# Patient Record
Sex: Male | Born: 1960 | Race: White | Hispanic: No | Smoking: Never smoker
Health system: Southern US, Community
[De-identification: ages and names within clinical notes are randomized; demographics above are authoritative.]

## PROBLEM LIST (undated history)

## (undated) DIAGNOSIS — E663 Overweight: Secondary | ICD-10-CM

## (undated) DIAGNOSIS — K219 Gastro-esophageal reflux disease without esophagitis: Secondary | ICD-10-CM

## (undated) DIAGNOSIS — I10 Essential (primary) hypertension: Secondary | ICD-10-CM

## (undated) DIAGNOSIS — N200 Calculus of kidney: Secondary | ICD-10-CM

## (undated) DIAGNOSIS — Z9889 Other specified postprocedural states: Secondary | ICD-10-CM

## (undated) DIAGNOSIS — E785 Hyperlipidemia, unspecified: Secondary | ICD-10-CM

## (undated) HISTORY — PX: INGUINAL HERNIA REPAIR: SUR1180

## (undated) HISTORY — PX: KIDNEY SURGERY: SHX687

---

## 2012-07-03 ENCOUNTER — Emergency Department (HOSPITAL_COMMUNITY): Payer: Federal, State, Local not specified - PPO

## 2012-07-03 ENCOUNTER — Inpatient Hospital Stay (HOSPITAL_COMMUNITY)
Admission: EM | Admit: 2012-07-03 | Discharge: 2012-07-04 | DRG: 183 | Disposition: A | Discharge status: Home / Self Care | Payer: Federal, State, Local not specified - PPO | Attending: Internal Medicine | Admitting: Internal Medicine

## 2012-07-03 ENCOUNTER — Other Ambulatory Visit: Payer: Self-pay

## 2012-07-03 ENCOUNTER — Encounter (HOSPITAL_COMMUNITY): Payer: Self-pay | Admitting: Emergency Medicine

## 2012-07-03 DIAGNOSIS — R079 Chest pain, unspecified: Secondary | ICD-10-CM | POA: Insufficient documentation

## 2012-07-03 DIAGNOSIS — E785 Hyperlipidemia, unspecified: Secondary | ICD-10-CM | POA: Diagnosis present

## 2012-07-03 DIAGNOSIS — Z7982 Long term (current) use of aspirin: Secondary | ICD-10-CM

## 2012-07-03 DIAGNOSIS — Z79899 Other long term (current) drug therapy: Secondary | ICD-10-CM

## 2012-07-03 DIAGNOSIS — I10 Essential (primary) hypertension: Secondary | ICD-10-CM | POA: Diagnosis present

## 2012-07-03 DIAGNOSIS — K219 Gastro-esophageal reflux disease without esophagitis: Secondary | ICD-10-CM

## 2012-07-03 DIAGNOSIS — I200 Unstable angina: Secondary | ICD-10-CM

## 2012-07-03 DIAGNOSIS — Z6831 Body mass index (BMI) 31.0-31.9, adult: Secondary | ICD-10-CM

## 2012-07-03 DIAGNOSIS — E669 Obesity, unspecified: Secondary | ICD-10-CM | POA: Diagnosis present

## 2012-07-03 HISTORY — DX: Essential (primary) hypertension: I10

## 2012-07-03 HISTORY — DX: Hyperlipidemia, unspecified: E78.5

## 2012-07-03 HISTORY — DX: Other specified postprocedural states: Z98.890

## 2012-07-03 HISTORY — DX: Gastro-esophageal reflux disease without esophagitis: K21.9

## 2012-07-03 HISTORY — DX: Overweight: E66.3

## 2012-07-03 HISTORY — DX: Calculus of kidney: N20.0

## 2012-07-03 LAB — COMPREHENSIVE METABOLIC PANEL
Alkaline Phosphatase: 56 U/L (ref 39–117)
BUN: 20 mg/dL (ref 6–23)
Chloride: 105 mEq/L (ref 96–112)
GFR calc Af Amer: >90 mL/min (ref >90)
Glucose, Bld: 90 mg/dL (ref 70–99)
Potassium: 3.9 mEq/L (ref 3.5–5.1)
Total Bilirubin: 0.4 mg/dL (ref 0.3–1.2)

## 2012-07-03 LAB — POCT I-STAT TROPONIN I: Troponin i, poc: 0 ng/mL (ref 0.00–0.08)

## 2012-07-03 LAB — CBC
HCT: 40 % (ref 39.0–52.0)
Hemoglobin: 13.8 g/dL (ref 13.0–17.0)
WBC: 6.5 10*3/uL (ref 4.0–10.5)

## 2012-07-03 LAB — URINALYSIS, ROUTINE W REFLEX MICROSCOPIC
Glucose, UA: NEGATIVE mg/dL
Hgb urine dipstick: NEGATIVE
Ketones, ur: NEGATIVE mg/dL
Protein, ur: NEGATIVE mg/dL

## 2012-07-03 LAB — PROTIME-INR: Prothrombin Time: 11.8 seconds (ref 11.6–15.2)

## 2012-07-03 LAB — TROPONIN I: Troponin I: <0.3 ng/mL (ref <0.30)

## 2012-07-03 MED ORDER — FLUTICASONE PROPIONATE 50 MCG/ACT NA SUSP
2.0000 | Freq: Every day | NASAL | Status: DC
  Administered 2012-07-03: 2 via NASAL
  Filled 2012-07-03: qty 16

## 2012-07-03 MED ORDER — HEPARIN (PORCINE) IN NACL 100-0.45 UNIT/ML-% IJ SOLN
1200.0000 [IU]/h | INTRAMUSCULAR | Status: DC
  Administered 2012-07-03 – 2012-07-04 (×2): 1200 [IU]/h via INTRAVENOUS
  Filled 2012-07-03 (×2): qty 250

## 2012-07-03 MED ORDER — FENTANYL CITRATE 0.05 MG/ML IJ SOLN
INTRAMUSCULAR | Status: AC
  Filled 2012-07-03: qty 2

## 2012-07-03 MED ORDER — SODIUM CHLORIDE 0.9 % IJ SOLN: 3.0000 mL | Freq: Two times a day (BID) | INTRAMUSCULAR | Status: DC

## 2012-07-03 MED ORDER — ATORVASTATIN CALCIUM 20 MG PO TABS
20.0000 mg | ORAL_TABLET | Freq: Every day | ORAL | Status: DC
  Administered 2012-07-03: 20 mg via ORAL
  Filled 2012-07-03 (×2): qty 1

## 2012-07-03 MED ORDER — ZOLPIDEM TARTRATE 5 MG PO TABS: 5.0000 mg | ORAL_TABLET | Freq: Every evening | ORAL | Status: DC | PRN

## 2012-07-03 MED ORDER — IRBESARTAN 150 MG PO TABS
150.0000 mg | ORAL_TABLET | Freq: Every day | ORAL | Status: DC
  Filled 2012-07-03: qty 1

## 2012-07-03 MED ORDER — SODIUM CHLORIDE 0.9 % IV SOLN: 250.0000 mL | INTRAVENOUS | Status: DC | PRN

## 2012-07-03 MED ORDER — HEPARIN BOLUS VIA INFUSION
4000.0000 [IU] | Freq: Once | INTRAVENOUS | Status: AC
  Administered 2012-07-03: 4000 [IU] via INTRAVENOUS

## 2012-07-03 MED ORDER — NITROGLYCERIN 0.4 MG SL SUBL: 0.4000 mg | SUBLINGUAL_TABLET | SUBLINGUAL | Status: DC | PRN

## 2012-07-03 MED ORDER — ASPIRIN 81 MG PO CHEW
324.0000 mg | CHEWABLE_TABLET | ORAL | Status: AC
  Administered 2012-07-04: 324 mg via ORAL
  Filled 2012-07-03: qty 4

## 2012-07-03 MED ORDER — SODIUM CHLORIDE 0.9 % IJ SOLN: 3.0000 mL | INTRAMUSCULAR | Status: DC | PRN

## 2012-07-03 MED ORDER — MORPHINE SULFATE 4 MG/ML IJ SOLN
4.0000 mg | Freq: Once | INTRAMUSCULAR | Status: AC
  Administered 2012-07-03: 4 mg via INTRAVENOUS
  Filled 2012-07-03: qty 1

## 2012-07-03 MED ORDER — FENTANYL CITRATE 0.05 MG/ML IJ SOLN
25.0000 ug | Freq: Once | INTRAMUSCULAR | Status: AC
  Administered 2012-07-03: 25 ug via INTRAVENOUS

## 2012-07-03 MED ORDER — ONDANSETRON 4 MG PO TBDP
4.0000 mg | ORAL_TABLET | Freq: Once | ORAL | Status: AC
  Administered 2012-07-03: 4 mg via ORAL
  Filled 2012-07-03: qty 1

## 2012-07-03 MED ORDER — SODIUM CHLORIDE 0.9 % IV SOLN
1000.0000 mL | INTRAVENOUS | Status: DC
  Administered 2012-07-03: 1000 mL via INTRAVENOUS

## 2012-07-03 MED ORDER — NITROGLYCERIN IN D5W 200-5 MCG/ML-% IV SOLN
5.0000 ug/min | INTRAVENOUS | Status: DC
  Administered 2012-07-03: 5 ug/min via INTRAVENOUS
  Filled 2012-07-03: qty 250

## 2012-07-03 MED ORDER — ASPIRIN EC 81 MG PO TBEC
81.0000 mg | DELAYED_RELEASE_TABLET | Freq: Every day | ORAL | Status: DC
  Filled 2012-07-03: qty 1

## 2012-07-03 MED ORDER — METOPROLOL SUCCINATE ER 100 MG PO TB24
100.0000 mg | ORAL_TABLET | Freq: Every day | ORAL | Status: DC
  Filled 2012-07-03: qty 1

## 2012-07-03 MED ORDER — ONDANSETRON HCL 4 MG/2ML IJ SOLN: 4.0000 mg | Freq: Four times a day (QID) | INTRAMUSCULAR | Status: DC | PRN

## 2012-07-03 MED ORDER — SODIUM CHLORIDE 0.9 % IV SOLN
1.0000 mL/kg/h | INTRAVENOUS | Status: DC
  Administered 2012-07-04: 1 mL/kg/h via INTRAVENOUS

## 2012-07-03 MED ORDER — ACETAMINOPHEN 325 MG PO TABS
650.0000 mg | ORAL_TABLET | ORAL | Status: DC | PRN
  Administered 2012-07-03: 650 mg via ORAL
  Filled 2012-07-03: qty 2

## 2012-07-03 NOTE — H&P (Signed)
Patient ID: Andrew Burns MRN: [id number], DOB/AGE: 1960/09/15   Admit date: 07/03/2012  Primary Physician: [facility] Primary Cardiologist: new - seen by D. Lagena Strand, MD  Pt. Profile:  52 year old male without prior cardiac history presented to ED this morning secondary to chest pain.  Problem List  Past Medical History  Diagnosis Date  . Hypertension   . Hyperlipemia   . Overweight     Past Surgical History  Procedure Laterality Date  . 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 Hernia repair      Allergies  No Known Allergies  HPI  52 year old male with the above problem list. He has no prior cardiac history. He works at the [employer]. He reports increasing life stress of the past few months related to marital issues. He is currently living at home with his 2 children, a 66 year old daughter, and a 75 year old handicapped son. He was in his usual state of health until yesterday when he noticed some dizziness after work. He did not feel like he was going to pass out. This persisted until he went to bed at night. When he got up this morning at approximately 5:30 AM, he noted to have 10 substernal chest pressure with mild dyspnea and no other associated symptoms. This persisted while he got ready for work and then presented to work at about 7am and told his boss that he was feeling poorly.  They called 911.  Upon EMS arrival pt received 2 sl ntg with relief of discomfort.  He was taken to the [facility] ED where ECG shows ant and inf twi - no old ecg for comparison.  His pain has waxed and waned here.  First two poc troponins are normal.  He is currently pain free.  Home Medications  Prior to Admission medications   Medication Sig Start Date End Date Taking? Authorizing Provider  aspirin EC 81 MG tablet Take 81 mg by mouth daily.   Yes Historical Provider, MD  atorvastatin (LIPITOR) 20 MG tablet Take 20 mg by mouth at bedtime.   Yes Historical Provider, MD    fluticasone (FLONASE) 50 MCG/ACT nasal spray Place 2 sprays into the nose at bedtime.   Yes Historical Provider, MD  metoprolol succinate (TOPROL-XL) 100 MG 24 hr tablet Take 100 mg by mouth daily. Take with or immediately following a meal.   Yes Historical Provider, MD  olmesartan (BENICAR) 20 MG tablet Take 20 mg by mouth daily.   Yes Historical Provider, MD  vitamin E 200 UNIT capsule Take 200 Units by mouth daily.   Yes Historical Provider, MD  aspirin 325 MG tablet Take 324 mg by mouth once.    Historical Provider, MD  nitroGLYCERIN (NITROSTAT) 0.4 MG SL tablet Place 0.4 mg under the tongue.    Historical Provider, MD   Family History  Family History  Problem Relation Age of Onset  . Stroke Father     multiple - first in his 40's, died @ 40  . Heart attack Mother     unclear timing - she is alive and well @ 86  . Other Brother     4 brothers - alive and well  . Other Sister     2 sisters - alive and well  . Heart attack Sister     died in her 26's, ? MI/drug abuse.    Social History  History   Social History  . Marital Status: Legally Separated    Spouse Name: N/A  Number of Children: N/A  . Years of Education: N/A   Occupational History  . Not on file.   Social History Main Topics  . Smoking status: Never Smoker   . Smokeless tobacco: Not on file  . Alcohol Use: 1.2 oz/week    2 Cans of beer per week     Comment: rare drink  . Drug Use: No  . Sexually Active: Not on file   Other Topics Concern  . Not on file   Social History Narrative   Lives in [place].  He is married but separated.  His two children are staying with him.  His son is 48 but handicapped ("mind of a 15 yr old).  His dtr is 15.    Review of Systems General:  No chills, fever, night sweats or weight changes.  Cardiovascular:  +++ chest pain, +++ dyspnea on exertion, no edema, orthopnea, palpitations, paroxysmal nocturnal dyspnea. Dermatological: No rash, lesions/masses Respiratory: No  cough, dyspnea Urologic: No hematuria, dysuria Abdominal:   No nausea, vomiting, diarrhea, bright red blood per rectum, melena, or hematemesis Neurologic:  No visual changes, wkns, changes in mental status. All other systems reviewed and are otherwise negative except as noted above.  Physical Exam  Blood pressure 127/79, pulse 75, temperature 98.2 F (36.8 C), temperature source Oral, resp. rate 14, height 5\' 7"  (1.702 m), weight 190 lb (86.183 kg), SpO2 96.00%.  General: Pleasant, NAD Psych: Normal affect. Neuro: Alert and oriented X 3. Moves all extremities spontaneously. HEENT: Normal  Neck: Supple without JVD.  Soft R carotid bruit. Lungs:  Resp regular and unlabored, CTA. Heart: RRR no s3, s4, or murmurs. Abdomen: Soft, non-tender, non-distended, BS + x 4.  Extremities: No clubbing, cyanosis or edema. DP/PT/Radials 2+ and equal bilaterally.  Labs  Trop i, poc: 0.00 x 2.  Lab Results  Component Value Date   WBC 6.5 07/03/2012   HGB 13.8 07/03/2012   HCT 40.0 07/03/2012   MCV 89.3 07/03/2012   PLT 227 07/03/2012     Recent Labs Lab 07/03/12 0955  NA 138  K 3.9  CL 105  CO2 25  BUN 20  CREATININE 0.75  CALCIUM 8.6  PROT 6.4  BILITOT 0.4  ALKPHOS 56  ALT 30  AST 18  GLUCOSE 90   Radiology/Studies  Dg Chest Portable 1 View  07/03/2012   *RADIOLOGY REPORT*  Clinical Data: Chest tightness.  Shortness of breath.  PORTABLE CHEST - 1 VIEW  Comparison: None.  Findings: Lungs are clear.  Heart size is normal.  No pneumothorax or pleural fluid.  IMPRESSION: Negative chest.   Original Report Authenticated By: Holley Dexter, M.D.    ECG  Rsr, 62, ant/inf twi.  ASSESSMENT :  1. [place] 2. HTN 3. HL 4. Obesity  Signed, Nicolasa Ducking, NP 07/03/2012, 2:49 PM  Plan/Discussion:  Patient seen and examined independently. Gilford Raid, NP note reviewed carefully - agree with his assessment and plan. I have edited the note based on my findings. Symptoms and ECG  concerning for [place]. Will admit for cath. Treat with heparin, ASA, NTG, b-blocker and statin. Will need aggressive RF management.   Alisah Grandberry,MD 3:00 PM

## 2012-07-03 NOTE — Consult Note (Signed)
ANTICOAGULATION CONSULT NOTE  Pharmacy Consult for Heparin Indication: chest pain/ACS  No Known Allergies  Patient Measurements: Height: 5\' 7"  (170.2 cm) Weight: 198 lb 3.1 oz (89.9 kg) IBW/kg (Calculated) : 66.1 Heparin Dosing Weight: 83.6kg  Vital Signs: Temp: 98.4 F (36.9 C) (06/18 2100) Temp src: Oral (06/18 2100) BP: 114/71 mmHg (06/18 2100) Pulse Rate: 71 (06/18 2100)  Labs:  Recent Labs  07/03/12 0955 07/03/12 1601 07/03/12 2200  HGB 13.8  --   --   HCT 40.0  --   --   PLT 227  --   --   APTT 25  --   --   LABPROT 11.8  --   --   INR 0.87  --   --   HEPARINUNFRC  --   --  0.40  CREATININE 0.75  --   --   TROPONINI  --  <0.30  --     Estimated Creatinine Clearance: 118.1 ml/min (by C-G formula based on Cr of 0.75).  Assessment: 52 yo male with chest pain for heparin Goal of Therapy:  Heparin level 0.3-0.7 units/ml Monitor platelets by anticoagulation protocol: Yes   Plan:  Continue Heparin at current rate Follow-up am labs.  Eddie Candle 07/03/2012,11:36 PM

## 2012-07-03 NOTE — ED Notes (Signed)
MD Davidson at bedside.

## 2012-07-03 NOTE — ED Notes (Signed)
Pt provided with crackers and water.

## 2012-07-03 NOTE — Consult Note (Signed)
ANTICOAGULATION CONSULT NOTE - Initial Consult  Pharmacy Consult for Heparin Indication: chest pain/ACS  No Known Allergies  Patient Measurements: Height: 5\' 7"  (170.2 cm) Weight: 190 lb (86.183 kg) IBW/kg (Calculated) : 66.1 Heparin Dosing Weight: 83.6kg  Vital Signs: Temp: 98.2 F (36.8 C) (06/18 0847) Temp src: Oral (06/18 0847) BP: 130/81 mmHg (06/18 1521) Pulse Rate: 73 (06/18 1521)  Labs:  Recent Labs  07/03/12 0955  HGB 13.8  HCT 40.0  PLT 227  APTT 25  LABPROT 11.8  INR 0.87  CREATININE 0.75    Estimated Creatinine Clearance: 115.8 ml/min (by C-G formula based on Cr of 0.75).   Medical History: Past Medical History  Diagnosis Date  . Hypertension   . Hyperlipemia   . Overweight     Medications:  No anticoagulants pta  Assessment: 51yom with history of HTN and HLD presents to the ED with CP. First two troponins are negative. He will begin heparin with plans for cath this admission. Baseline CBC and renal function wnl.  Goal of Therapy:  Heparin level 0.3-0.7 units/ml Monitor platelets by anticoagulation protocol: Yes   Plan:  1) Heparin bolus 4000 units x 1 2) Heparin drip at 1200 units/hr 3) 6 hour heparin level 4) Daily heparin level and CBC  Fredrik Rigger 07/03/2012,3:48 PM

## 2012-07-03 NOTE — ED Notes (Addendum)
Pt started having dizziness last night and started having chest pain this am when he woke. Pt originally had pain level of 2/10. EMS gave ASA 324 mg and NTG x2 SL.  Pt reports pain level on arrival was relieved to 1/10 by medication.

## 2012-07-03 NOTE — ED Notes (Signed)
Cardiology consult at bedside.

## 2012-07-03 NOTE — ED Provider Notes (Signed)
History     CSN: [id number]  Arrival date & time 07/03/12  4098   First MD Initiated Contact with Patient 07/03/12 [phone]      Chief Complaint  Patient presents with  . Chest Pain    (Consider location/radiation/quality/duration/timing/severity/associated sxs/prior treatment) Patient is a 52 y.o. male presenting with chest pain. The history is provided by the patient. No language interpreter was used.  Chest Pain Pain location:  Substernal area (Patient had felt dizzy yesterday. This morning he woke up with a tightness in his chest. He says it's hard for him to breathe. He had prior chest pain in 2009, was found to be hypertensive, and was placed on Benicar that time.) Pain quality: tightness   Pain quality comment:  He called EMS and was given aspirin and nitroglycerin, with some relief.  Now the tightness is coming back.   Pain radiates to:  Does not radiate Pain severity:  Moderate Duration: Intermittent pain. Timing:  Intermittent Progression:  Unchanged Chronicity:  New Relieved by: Transient relief with aspirin and nitroglycerin.  Worsened by:  Nothing tried Ineffective treatments:  None tried Associated symptoms: dizziness   Risk factors: hypertension and male sex     Past Medical History  Diagnosis Date  . Hypertension   . Hyperlipemia     Past Surgical History  Procedure Laterality Date  . 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 Hernia repair      History reviewed. No pertinent family history.  History  Substance Use Topics  . Smoking status: Never Smoker   . Smokeless tobacco: Not on file  . Alcohol Use: 1.2 oz/week    2 Cans of beer per week      Review of Systems  Constitutional: Negative.   HENT: Negative.   Eyes: Negative.   Respiratory: Negative.   Cardiovascular: Positive for chest pain.       History of hypertension.  Genitourinary: Negative.   Musculoskeletal: Negative.   Skin: Negative.   Neurological: Positive for dizziness.   Psychiatric/Behavioral: Negative.     Allergies  Review of patient's allergies indicates no known allergies.  Home Medications   Current Outpatient Rx  Name  Route  Sig  Dispense  Refill  . aspirin EC 81 MG tablet   Oral   Take 81 mg by mouth daily.         . fluticasone (FLONASE) 50 MCG/ACT nasal spray   Nasal   Place 2 sprays into the nose at bedtime.         . Metoprolol Succinate (TOPROL XL PO)   Oral   Take by mouth.         . olmesartan (BENICAR) 20 MG tablet   Oral   Take 20 mg by mouth daily.         Marland Kitchen PRESCRIPTION MEDICATION   Oral   Take 1 tablet by mouth at bedtime.         . vitamin E 200 UNIT capsule   Oral   Take 200 Units by mouth daily.         Marland Kitchen aspirin 325 MG tablet   Oral   Take 324 mg by mouth once.         . nitroGLYCERIN (NITROSTAT) 0.4 MG SL tablet   Sublingual   Place 0.4 mg under the tongue.           BP 128/93  Pulse 61  Temp(Src) 98.2 F (36.8 C) (Oral)  Resp 18  Ht  5\' 7"  (1.702 m)  Wt 190 lb (86.183 kg)  BMI 29.75 kg/m2  SpO2 98%  Physical Exam  Nursing note and vitals reviewed. Constitutional: He is oriented to person, place, and time.  Middle aged man, in moderate distress with chest tightness.  HENT:  Head: Normocephalic and atraumatic.  Right Ear: External ear normal.  Left Ear: External ear normal.  Mouth/Throat: Oropharynx is clear and moist.  Eyes: Conjunctivae and EOM are normal. Pupils are equal, round, and reactive to light.  Neck: Normal range of motion. Neck supple.  Cardiovascular: Normal rate, regular rhythm and normal heart sounds.   Pulmonary/Chest: Effort normal and breath sounds normal.  Abdominal: Soft. Bowel sounds are normal.  Musculoskeletal: Normal range of motion. He exhibits no edema and no tenderness.  Neurological: He is alert and oriented to person, place, and time.  No sensory or motor deficit.   Skin: Skin is warm and dry.  Psychiatric: He has a normal mood and affect.  His behavior is normal.    ED Course  Procedures (including critical care time)   Date: 07/03/2012  Rate: 62  Rhythm: normal sinus rhythm  QRS Axis: normal  Intervals: normal  ST/T Wave abnormalities: None  Conduction Disutrbances:   Incomplete right bundle branch block  Narrative Interpretation:  Borderline EKG  Old EKG Reviewed: none available  9:52 AM Pt seen --> physical exam performed.  He is having recurrent pain despite po aspirin and sublingual nitroglycerin.  Will give IV morphine and Zofran, and start IV nitroglycerin.   11:25 AM Results for orders placed during the hospital encounter of 07/03/12  CBC      Result Value Range   WBC 6.5  4.0 - 10.5 K/uL   RBC 4.48  4.22 - 5.81 MIL/uL   Hemoglobin 13.8  13.0 - 17.0 g/dL   HCT 91.4  78.2 - 95.6 %   MCV 89.3  78.0 - 100.0 fL   MCH 30.8  26.0 - 34.0 pg   MCHC 34.5  30.0 - 36.0 g/dL   RDW 21.3  08.6 - 57.8 %   Platelets 227  150 - 400 K/uL  COMPREHENSIVE METABOLIC PANEL      Result Value Range   Sodium 138  135 - 145 mEq/L   Potassium 3.9  3.5 - 5.1 mEq/L   Chloride 105  96 - 112 mEq/L   CO2 25  19 - 32 mEq/L   Glucose, Bld 90  70 - 99 mg/dL   BUN 20  6 - 23 mg/dL   Creatinine, Ser 4.69  0.50 - 1.35 mg/dL   Calcium 8.6  8.4 - 62.9 mg/dL   Total Protein 6.4  6.0 - 8.3 g/dL   Albumin 3.7  3.5 - 5.2 g/dL   AST 18  0 - 37 U/L   ALT 30  0 - 53 U/L   Alkaline Phosphatase 56  39 - 117 U/L   Total Bilirubin 0.4  0.3 - 1.2 mg/dL   GFR calc non Af Amer >90  >90 mL/min   GFR calc Af Amer >90  >90 mL/min  PROTIME-INR      Result Value Range   Prothrombin Time 11.8  11.6 - 15.2 seconds   INR 0.87  0.00 - 1.49  APTT      Result Value Range   aPTT 25  24 - 37 seconds  URINALYSIS, ROUTINE W REFLEX MICROSCOPIC      Result Value Range   Color, Urine YELLOW  YELLOW  APPearance CLEAR  CLEAR   Specific Gravity, Urine 1.014  1.005 - 1.030   pH 6.0  5.0 - 8.0   Glucose, UA NEGATIVE  NEGATIVE mg/dL   Hgb urine dipstick  NEGATIVE  NEGATIVE   Bilirubin Urine NEGATIVE  NEGATIVE   Ketones, ur NEGATIVE  NEGATIVE mg/dL   Protein, ur NEGATIVE  NEGATIVE mg/dL   Urobilinogen, UA 0.2  0.0 - 1.0 mg/dL   Nitrite NEGATIVE  NEGATIVE   Leukocytes, UA NEGATIVE  NEGATIVE  POCT I-STAT TROPONIN I      Result Value Range   Troponin i, poc 0.00  0.00 - 0.08 ng/mL   Comment 3            Dg Chest Portable 1 View  07/03/2012   *RADIOLOGY REPORT*  Clinical Data: Chest tightness.  Shortness of breath.  PORTABLE CHEST - 1 VIEW  Comparison: None.  Findings: Lungs are clear.  Heart size is normal.  No pneumothorax or pleural fluid.  IMPRESSION: Negative chest.   Original Report Authenticated By: Holley Dexter, M.D.    11:25 AM Lab tests reassuringly negative.  Pt pain free on nitroglycerin continuous infusion.  Call to [facility] to see pt for unstable angina.   1. Unstable angina   2. Hypertension             Carleene Cooper III, MD 07/04/12 [phone]

## 2012-07-04 ENCOUNTER — Encounter (HOSPITAL_COMMUNITY): Payer: Self-pay | Admitting: Physician Assistant

## 2012-07-04 ENCOUNTER — Encounter (HOSPITAL_COMMUNITY)
Admission: EM | Disposition: A | Discharge status: Home / Self Care | Payer: Self-pay | Source: Home / Self Care | Attending: Internal Medicine

## 2012-07-04 DIAGNOSIS — K219 Gastro-esophageal reflux disease without esophagitis: Secondary | ICD-10-CM

## 2012-07-04 DIAGNOSIS — R079 Chest pain, unspecified: Secondary | ICD-10-CM

## 2012-07-04 HISTORY — PX: LEFT HEART CATHETERIZATION WITH CORONARY ANGIOGRAM: SHX5451

## 2012-07-04 LAB — CBC
Hemoglobin: 13.7 g/dL (ref 13.0–17.0)
MCH: 30.4 pg (ref 26.0–34.0)
MCV: 90.7 fL (ref 78.0–100.0)
RBC: 4.51 MIL/uL (ref 4.22–5.81)

## 2012-07-04 LAB — TROPONIN I: Troponin I: <0.3 ng/mL (ref <0.30)

## 2012-07-04 LAB — LIPID PANEL
Total CHOL/HDL Ratio: 3.7 RATIO
VLDL: 37 mg/dL (ref 0–40)

## 2012-07-04 SURGERY — LEFT HEART CATHETERIZATION WITH CORONARY ANGIOGRAM
Anesthesia: LOCAL

## 2012-07-04 MED ORDER — SODIUM CHLORIDE 0.9 % IV SOLN: INTRAVENOUS | Status: AC

## 2012-07-04 MED ORDER — HEPARIN (PORCINE) IN NACL 2-0.9 UNIT/ML-% IJ SOLN
INTRAMUSCULAR | Status: AC
  Filled 2012-07-04: qty 1000

## 2012-07-04 MED ORDER — HEPARIN SODIUM (PORCINE) 1000 UNIT/ML IJ SOLN
INTRAMUSCULAR | Status: AC
  Filled 2012-07-04: qty 1

## 2012-07-04 MED ORDER — NITROGLYCERIN 0.2 MG/ML ON CALL CATH LAB
INTRAVENOUS | Status: AC
  Filled 2012-07-04: qty 1

## 2012-07-04 MED ORDER — MIDAZOLAM HCL 2 MG/2ML IJ SOLN
INTRAMUSCULAR | Status: AC
  Filled 2012-07-04: qty 2

## 2012-07-04 MED ORDER — PANTOPRAZOLE SODIUM 40 MG PO TBEC: 40.0000 mg | DELAYED_RELEASE_TABLET | Freq: Every day | ORAL | Status: DC

## 2012-07-04 MED ORDER — PANTOPRAZOLE SODIUM 40 MG PO TBEC
40.0000 mg | DELAYED_RELEASE_TABLET | Freq: Every day | ORAL | Status: DC
  Administered 2012-07-04: 11:00:00 40 mg via ORAL
  Filled 2012-07-04: qty 1

## 2012-07-04 MED ORDER — VERAPAMIL HCL 2.5 MG/ML IV SOLN
INTRAVENOUS | Status: AC
  Filled 2012-07-04: qty 2

## 2012-07-04 MED ORDER — LIDOCAINE HCL (PF) 1 % IJ SOLN
INTRAMUSCULAR | Status: AC
  Filled 2012-07-04: qty 30

## 2012-07-04 MED ORDER — FENTANYL CITRATE 0.05 MG/ML IJ SOLN
INTRAMUSCULAR | Status: AC
  Filled 2012-07-04: qty 2

## 2012-07-04 NOTE — Progress Notes (Signed)
Cath lab called and said pt is being discharged directly. All belongings packed up and their tech is coming to get them for discharge.   Delynn Flavin, RN, BSN

## 2012-07-04 NOTE — Discharge Summary (Signed)
Discharge Summary   Patient ID: Andrew Burns MRN: [id number], DOB/AGE: [birth year] 52 y.o. Admit date: 07/03/2012 D/C date:     07/04/2012   Primary Discharge Diagnoses:  1. Chest pain, felt secondary to GERD - PPI added - cath showing no evidence of CAD 07/04/12  Secondary Discharge Diagnoses:  1. HTN 2. HLD 3. Overweight   Hospital Course: Andrew Burns is a 52 y/o M with history of HTN, hyperlipidemia who presented to [facility] with chest pain. He has had increasing life stress related to marital issues. He was in his usual state of health until the day before yesterday when he noticed some dizziness after work. He did not feel like he was going to pass out. This persisted until he went to bed at night. He got up the following morning at approximately 5:30 AM, he noted to have 10 substernal chest pressure with mild dyspnea and no other associated symptoms. He denied any syncope or palpitations. This persisted while he got ready for work and then presented to work at about 7am and told his boss that he was feeling poorly. They called 911. He was given 2 SL NTG with relief of discomfort. ECG showed anterior and inferior TWI with no old ECG for comparison. Troponins were normal. CXR was negative. Otherwise labs were unremarkable. Vitals were stable. He was not tachycardic, hypoxic or tachypnic. His symptoms were felt concerning for [place] so he was admitted with plan for cath. He was placed on IV heparin.Troponins remained negative. TSH was normal. Cardiac cath was performed this morning showing no evidence of CAD. EF was 55-60%. His chest pain was felt likely related to GERD and Dr. Clifton James recommended Protonix 40mg  daily. Andrew Burns does note a significant history of this in the past. He has had no further dizziness.  Dr. Clifton James has seen and examined the patient today and feels he is stable for discharge. Dr. Clifton James would like the patient to follow up with his PCP and does not  feel that the patient requires any further cardiac workup. He was instructed to contact his PCP if the dizziness recurs, and not to drive if he has another episode.  Discharge Vitals: Blood pressure 131/79, pulse 72, temperature 98.5 F (36.9 C), temperature source Oral, resp. rate 18, height 5\' 7"  (1.702 m), weight 198 lb 3.1 oz (89.9 kg), SpO2 95.00%.  Labs: Lab Results  Component Value Date   WBC 6.6 07/04/2012   HGB 13.7 07/04/2012   HCT 40.9 07/04/2012   MCV 90.7 07/04/2012   PLT 228 07/04/2012     Recent Labs Lab 07/03/12 0955  NA 138  K 3.9  CL 105  CO2 25  BUN 20  CREATININE 0.75  CALCIUM 8.6  PROT 6.4  BILITOT 0.4  ALKPHOS 56  ALT 30  AST 18  GLUCOSE 90    Recent Labs  07/03/12 1601 07/03/12 2201 07/04/12 0425  TROPONINI <0.30 <0.30 <0.30   Lab Results  Component Value Date   CHOL 189 07/04/2012   HDL 51 07/04/2012   LDLCALC 101* 07/04/2012   TRIG 186* 07/04/2012    Diagnostic Studies/Procedures   Cardiac catheterization this admission, please see full report and above for summary.  Dg Chest Portable 1 View6/18/2014   *RADIOLOGY REPORT*  Clinical Data: Chest tightness.  Shortness of breath.  PORTABLE CHEST - 1 VIEW  Comparison: None.  Findings: Lungs are clear.  Heart size is normal.  No pneumothorax or pleural fluid.  IMPRESSION: Negative chest.  Original Report Authenticated By: Holley Dexter, M.D.    Discharge Medications     Medication List    TAKE these medications       aspirin EC 81 MG tablet  Take 81 mg by mouth daily.     atorvastatin 20 MG tablet  Commonly known as:  LIPITOR  Take 20 mg by mouth at bedtime.     fluticasone 50 MCG/ACT nasal spray  Commonly known as:  FLONASE  Place 2 sprays into the nose at bedtime.     metoprolol succinate 100 MG 24 hr tablet  Commonly known as:  TOPROL-XL  Take 100 mg by mouth daily. Take with or immediately following a meal.     olmesartan 20 MG tablet  Commonly known as:  BENICAR  Take 20  mg by mouth daily.     pantoprazole 40 MG tablet  Commonly known as:  PROTONIX  Take 1 tablet (40 mg total) by mouth daily.     vitamin E 200 UNIT capsule  Take 200 Units by mouth daily.        Disposition   The patient will be discharged in stable condition to home. Discharge Orders   Future Orders Complete By Expires     Diet - low sodium heart healthy  As directed     Increase activity slowly  As directed     Comments:      No driving for 2 days. Do not drive if you are feeling dizzy. No lifting over 5 lbs for 1 week. No sexual activity for 1 week. You may return to work on 07/11/12. Keep procedure site clean & dry. If you notice increased pain, swelling, bleeding or pus, call/return!  You may shower, but no soaking baths/hot tubs/pools for 1 week.      Follow-up Information   Follow up with Primary Care Provider. (For further evaluation and routine care)       Follow up with [facility], MD. (If you notice any problems with your cath site, feel free to call our office.)    Contact information:   [address] [place] [address] [phone]         Duration of Discharge Encounter: Greater than 30 minutes including physician and PA time.  Signed, Ronie Spies PA-C 07/04/2012, 2:40 PM

## 2012-07-04 NOTE — Progress Notes (Signed)
Utilization Review Completed Raynie Steinhaus J. Athina Fahey, RN, BSN, NCM [phone]  

## 2012-07-04 NOTE — Interval H&P Note (Signed)
History and Physical Interval Note:  07/04/2012 8:59 AM  Andrew Burns  has presented today for cardiac cath with the diagnosis of cp  The various methods of treatment have been discussed with the patient and family. After consideration of risks, benefits and other options for treatment, the patient has consented to  Procedure(s): LEFT HEART CATHETERIZATION WITH CORONARY ANGIOGRAM (N/A) as a surgical intervention .  The patient's history has been reviewed, patient examined, no change in status, stable for surgery.  I have reviewed the patient's chart and labs.  Questions were answered to the patient's satisfaction.     MCALHANY,CHRISTOPHER

## 2012-07-04 NOTE — CV Procedure (Signed)
    Cardiac Catheterization Operative Report  Andrew Burns [id number] 6/19/20149:29 AM No primary provider on file.  Procedure Performed:  1. Left Heart Catheterization 2. Selective Coronary Angiography 3. Left ventricular angiogram  Operator: Verne Carrow, MD  Arterial access site:  Right radial artery.   Indication: 52 yo male with history of HTN, HLD admitted with chest pain. He was seen by Dr. Gala Romney and cardiac cath arranged to exclude CAD.                                       Procedure Details: The risks, benefits, complications, treatment options, and expected outcomes were discussed with the patient. The patient and/or family concurred with the proposed plan, giving informed consent. The patient was brought to the cath lab after IV hydration was begun and oral premedication was given. The patient was further sedated with Versed and Fentanyl. The right wrist was assessed with an Allens test which was positive. The right wrist was prepped and draped in a sterile fashion. 1% lidocaine was used for local anesthesia. Using the modified Seldinger access technique, a 5 French sheath was placed in the right radial artery. 3 mg Verapamil was given through the sheath. 4500 units IV heparin was given. Standard diagnostic catheters were used to perform selective coronary angiography. A pigtail catheter was used to perform a left ventricular angiogram. The sheath was removed from the right radial artery and a Terumo hemostasis band was applied at the arteriotomy site on the right wrist.    There were no immediate complications. The patient was taken to the recovery area in stable condition.   Hemodynamic Findings: Central aortic pressure: 109/69 Left ventricular pressure: 108/10/14  Angiographic Findings:  Left main: No obstructive disease.   Left Anterior Descending Artery: Large caliber vessel that courses to the apex without obstructive disease. Moderate caliber  diagonal branch without obstructive disease.   Circumflex Artery: Moderate caliber vessel with moderate caliber obtuse marginal branch. No obstructive disease.   Right Coronary Artery: Large dominant vessel with no obstructive disease.   Left Ventricular Angiogram: LVEF=55-60%.  Impression: 1. No angiographic evidence of CAD 2. Normal LV systolic function 3. Non-cardiac chest pain. Likely secondary to GERD  Recommendations: Will start Protonix 40 mg po QDaily. No further ischemic workup. D/C home after bedrest. Follow up in primary care.        Complications:  None. The patient tolerated the procedure well.

## 2012-07-05 NOTE — Discharge Summary (Signed)
See full note.cdm 

## 2012-07-14 ENCOUNTER — Other Ambulatory Visit (HOSPITAL_COMMUNITY): Payer: Self-pay | Admitting: Physician Assistant

## 2012-07-25 ENCOUNTER — Ambulatory Visit (INDEPENDENT_AMBULATORY_CARE_PROVIDER_SITE_OTHER): Payer: Federal, State, Local not specified - PPO | Admitting: Physician Assistant

## 2012-07-25 ENCOUNTER — Encounter: Payer: Self-pay | Admitting: Physician Assistant

## 2012-07-25 VITALS — BP 110/80 | HR 60 | Ht 67.0 in | Wt 201.0 lb

## 2012-07-25 DIAGNOSIS — R42 Dizziness and giddiness: Secondary | ICD-10-CM

## 2012-07-25 DIAGNOSIS — R079 Chest pain, unspecified: Secondary | ICD-10-CM

## 2012-07-25 DIAGNOSIS — I10 Essential (primary) hypertension: Secondary | ICD-10-CM

## 2012-07-25 NOTE — Patient Instructions (Addendum)
Your physician recommends that you schedule a follow-up appointment as needed with Cardiology.   Please contact your PCP to discuss dizziness.

## 2012-07-25 NOTE — Assessment & Plan Note (Signed)
Currently being worked up by Dr. in [place]. Is a little better with meclizine. Patient has not been orthostatic in the past.

## 2012-07-25 NOTE — Progress Notes (Signed)
HPI:  This is a 52 year old patient who underwent cardiac catheterization on 07/04/12 for chest pain and T wave inversion anterior inferior. Cardiac cath revealed normal coronary arteries and normal LV function. The patient also has had dizziness that has been going on for his chest pain. He is now being seen by Dr. in [place] and treated for vertigo. He has not been orthostatic in the past. He also has hypertension and hyperlipidemia followed by his primary care doctor. He has not had any chest pain since his hospitalization.  No Known Allergies  Current Outpatient Prescriptions on File Prior to Visit: aspirin EC 81 MG tablet, Take 81 mg by mouth daily., Disp: , Rfl:  atorvastatin (LIPITOR) 20 MG tablet, Take 20 mg by mouth at bedtime., Disp: , Rfl:  fluticasone (FLONASE) 50 MCG/ACT nasal spray, Place 2 sprays into the nose at bedtime., Disp: , Rfl:  olmesartan (BENICAR) 20 MG tablet, Take 20 mg by mouth daily., Disp: , Rfl:    No current facility-administered medications on file prior to visit.   Past Medical History:   Hypertension                                                 Hyperlipemia                                                 Overweight                                                   Kidney stones                                                  Comment:"no surgeries; have plenty in me" (07/03/2012)   GERD (gastroesophageal reflux disease)                       H/O cardiac catheterization                                    Comment:Normal coronaries with normal EF 06/2012  Past Surgical History:   INGUINAL HERNIA REPAIR                          Right ~ 2012       KIDNEY SURGERY                                  Right ~ 2005         Comment:tumor removed from kidney- benign  Review of patient's family history indicates:   Stroke                         Father  Comment: multiple - first in his 67's, died @ 76   Heart attack                   Mother                      Comment: unclear timing - she is alive and well @              91   Other                          Brother                    Comment: 4 brothers - alive and well   Other                          Sister                     Comment: 2 sisters - alive and well   Heart attack                   Sister                     Comment: died in her 75's, ? MI/drug abuse.   Social History   Marital Status: Legally Separated   Spouse Name:                      Years of Education:                 Number of children:             Occupational History   None on file  Social History Main Topics   Smoking Status: Never Smoker                     Smokeless Status: Never Used                       Alcohol Use: Yes           3.6 oz/week      6 Cans of beer per week      Comment: 07/03/2012 "1-2 cans beer qod"   Drug Use: No             Sexual Activity: Not Currently      Other Topics            Concern   None on file  Social History Narrative   Lives in [place].  He is married but separated.  His two children are staying with him.  His son is 18 but handicapped ("mind of a 54 yr old).  His dtr is 15.    ROS: See history of present illness otherwise negative   PHYSICAL EXAM: Well-nournished, in no acute distress. Neck: No JVD, HJR, Bruit, or thyroid enlargement  Lungs: No tachypnea, clear without wheezing, rales, or rhonchi  Cardiovascular: RRR, PMI not displaced, heart sounds normal, no murmurs, gallops, bruit, thrill, or heave.  Abdomen: BS normal. Soft without organomegaly, masses, lesions or tenderness.  Extremities: Right arm and cath site without hematoma or hemorrhage good brachial and radial pulses, lower extremities without cyanosis, clubbing or edema. Good distal pulses bilateral  SKin: Warm, no lesions or rashes   Musculoskeletal: No deformities  Neuro: no focal signs  BP 110/80  Pulse 60  Ht 5\' 7"  (1.702 m)  Wt 201 lb (91.173 kg)  BMI 31.47  kg/m2

## 2012-07-25 NOTE — Assessment & Plan Note (Signed)
Normal coronary arteries and LV function on cardiac cath on 07/04/12

## 2012-07-25 NOTE — Assessment & Plan Note (Signed)
Stable

## 2013-12-25 ENCOUNTER — Encounter (HOSPITAL_COMMUNITY): Payer: Self-pay | Admitting: Cardiovascular Disease

## 2014-01-08 IMAGING — DX DG CHEST 1V PORT
1 series · 1 of 1 positions shown · non-contrast
Comparison: None.

CLINICAL DATA: Chest tightness.  Shortness of breath.

PORTABLE CHEST - 1 VIEW

[portable]
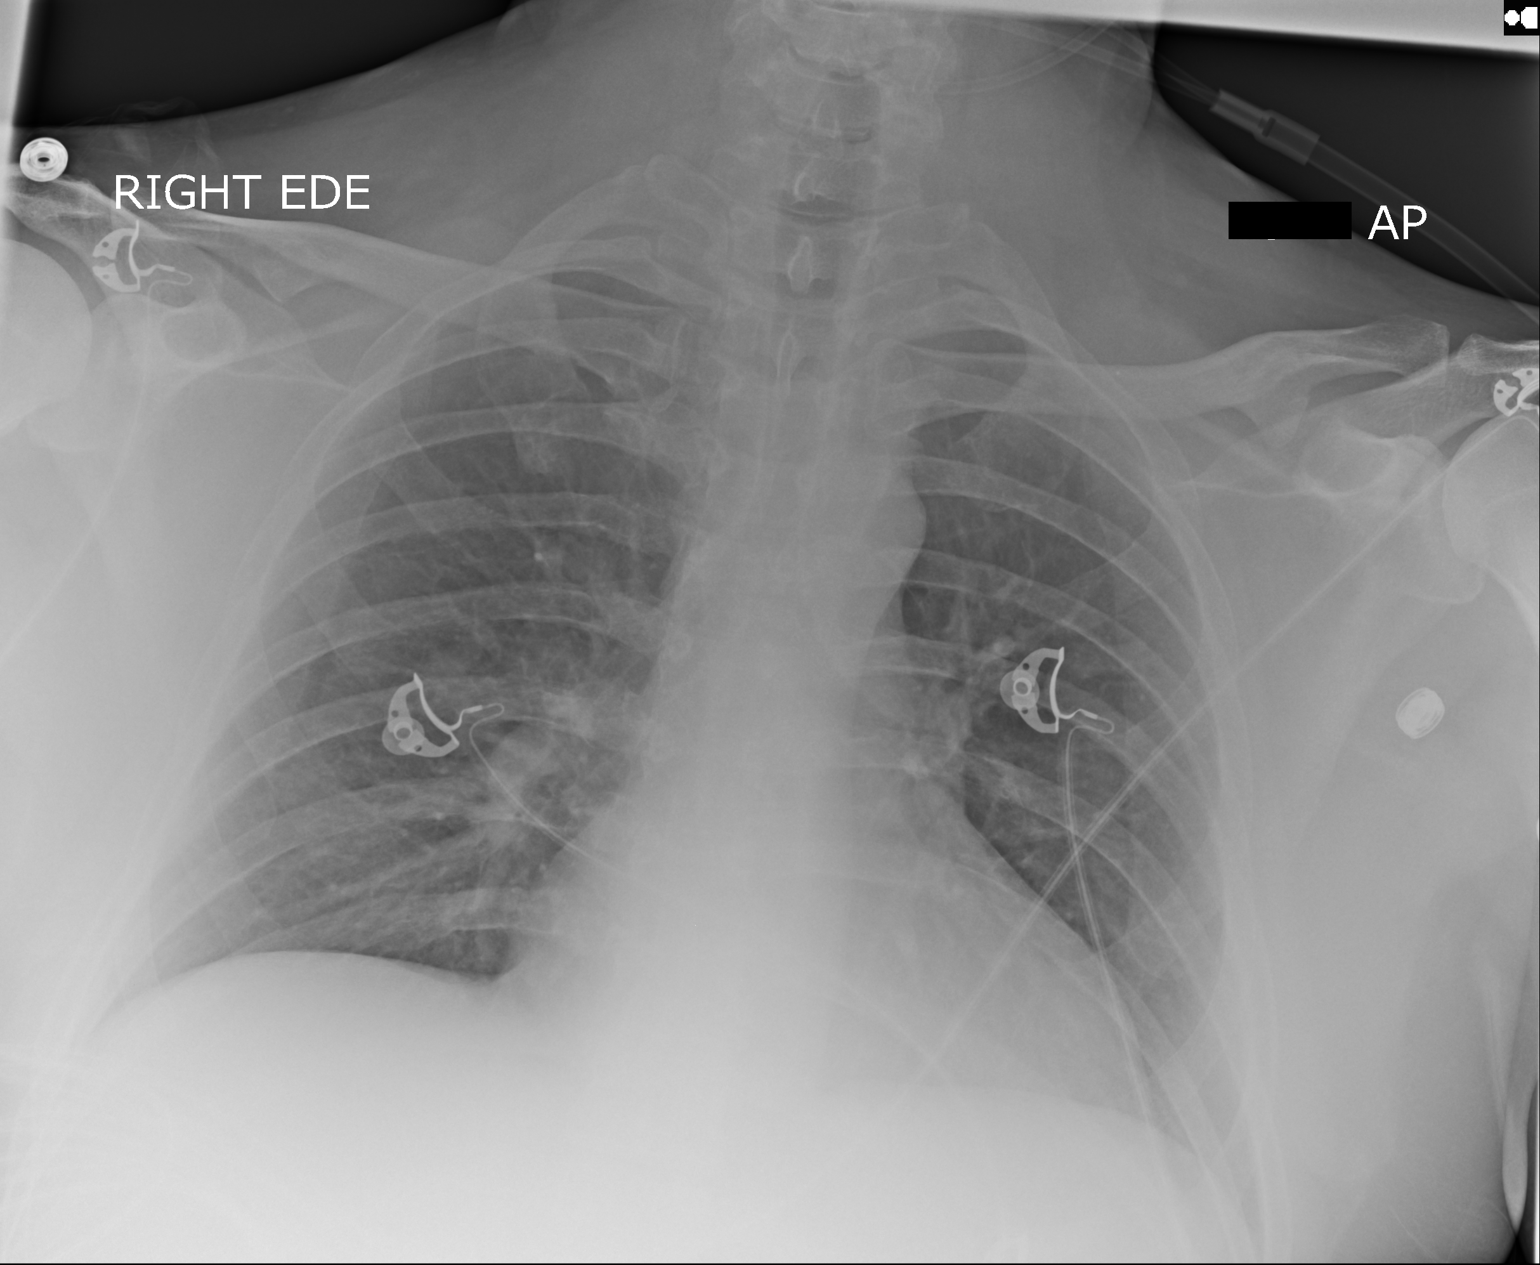

[1 of 1 positions shown; findings below may reference images not displayed]

FINDINGS: Lungs are clear.  Heart size is normal.  No pneumothorax
or pleural fluid.
IMPRESSION: Negative chest.

## 2015-02-07 ENCOUNTER — Encounter (HOSPITAL_COMMUNITY): Payer: Self-pay

## 2015-02-07 ENCOUNTER — Inpatient Hospital Stay (HOSPITAL_COMMUNITY)
Admission: AD | Admit: 2015-02-07 | Discharge: 2015-02-10 | DRG: 885 | Disposition: A | Discharge status: Home / Self Care | Payer: Federal, State, Local not specified - PPO | Attending: Psychiatry | Admitting: Psychiatry

## 2015-02-07 ENCOUNTER — Emergency Department (HOSPITAL_COMMUNITY)
Admission: EM | Admit: 2015-02-07 | Discharge: 2015-02-07 | Disposition: A | Discharge status: Other Acute Inpatient Hospital | Payer: Federal, State, Local not specified - PPO | Attending: Emergency Medicine | Admitting: Emergency Medicine

## 2015-02-07 ENCOUNTER — Encounter (HOSPITAL_COMMUNITY): Payer: Self-pay | Admitting: Oncology

## 2015-02-07 DIAGNOSIS — I10 Essential (primary) hypertension: Secondary | ICD-10-CM | POA: Insufficient documentation

## 2015-02-07 DIAGNOSIS — F332 Major depressive disorder, recurrent severe without psychotic features: Secondary | ICD-10-CM | POA: Diagnosis not present

## 2015-02-07 DIAGNOSIS — F1310 Sedative, hypnotic or anxiolytic abuse, uncomplicated: Secondary | ICD-10-CM | POA: Diagnosis present

## 2015-02-07 DIAGNOSIS — Z8249 Family history of ischemic heart disease and other diseases of the circulatory system: Secondary | ICD-10-CM

## 2015-02-07 DIAGNOSIS — R45851 Suicidal ideations: Secondary | ICD-10-CM | POA: Insufficient documentation

## 2015-02-07 DIAGNOSIS — F419 Anxiety disorder, unspecified: Secondary | ICD-10-CM | POA: Diagnosis present

## 2015-02-07 DIAGNOSIS — Z7982 Long term (current) use of aspirin: Secondary | ICD-10-CM | POA: Insufficient documentation

## 2015-02-07 DIAGNOSIS — Z823 Family history of stroke: Secondary | ICD-10-CM

## 2015-02-07 DIAGNOSIS — K219 Gastro-esophageal reflux disease without esophagitis: Secondary | ICD-10-CM | POA: Insufficient documentation

## 2015-02-07 DIAGNOSIS — E785 Hyperlipidemia, unspecified: Secondary | ICD-10-CM | POA: Diagnosis not present

## 2015-02-07 DIAGNOSIS — G4700 Insomnia, unspecified: Secondary | ICD-10-CM | POA: Diagnosis present

## 2015-02-07 DIAGNOSIS — F329 Major depressive disorder, single episode, unspecified: Secondary | ICD-10-CM | POA: Insufficient documentation

## 2015-02-07 DIAGNOSIS — T1491 Suicide attempt: Secondary | ICD-10-CM | POA: Diagnosis present

## 2015-02-07 DIAGNOSIS — Z79899 Other long term (current) drug therapy: Secondary | ICD-10-CM | POA: Diagnosis not present

## 2015-02-07 DIAGNOSIS — F1010 Alcohol abuse, uncomplicated: Secondary | ICD-10-CM | POA: Diagnosis not present

## 2015-02-07 DIAGNOSIS — Z7951 Long term (current) use of inhaled steroids: Secondary | ICD-10-CM | POA: Diagnosis not present

## 2015-02-07 DIAGNOSIS — Z813 Family history of other psychoactive substance abuse and dependence: Secondary | ICD-10-CM

## 2015-02-07 DIAGNOSIS — Z9889 Other specified postprocedural states: Secondary | ICD-10-CM | POA: Insufficient documentation

## 2015-02-07 DIAGNOSIS — Z87442 Personal history of urinary calculi: Secondary | ICD-10-CM | POA: Insufficient documentation

## 2015-02-07 DIAGNOSIS — E663 Overweight: Secondary | ICD-10-CM | POA: Insufficient documentation

## 2015-02-07 LAB — ACETAMINOPHEN LEVEL

## 2015-02-07 LAB — CBC
HEMATOCRIT: 48.6 % (ref 39.0–52.0)
HEMOGLOBIN: 17.6 g/dL — AB (ref 13.0–17.0)
MCH: 32.4 pg (ref 26.0–34.0)
MCHC: 36.2 g/dL — AB (ref 30.0–36.0)
MCV: 89.5 fL (ref 78.0–100.0)
Platelets: 249 10*3/uL (ref 150–400)
RBC: 5.43 MIL/uL (ref 4.22–5.81)
RDW: 12.8 % (ref 11.5–15.5)
WBC: 10.4 10*3/uL (ref 4.0–10.5)

## 2015-02-07 LAB — COMPREHENSIVE METABOLIC PANEL
ALBUMIN: 4.7 g/dL (ref 3.5–5.0)
ALK PHOS: 64 U/L (ref 38–126)
ALT: 40 U/L (ref 17–63)
ANION GAP: 12 (ref 5–15)
AST: 25 U/L (ref 15–41)
BUN: 22 mg/dL — ABNORMAL HIGH (ref 6–20)
CALCIUM: 10 mg/dL (ref 8.9–10.3)
CO2: 27 mmol/L (ref 22–32)
CREATININE: 0.88 mg/dL (ref 0.61–1.24)
Chloride: 102 mmol/L (ref 101–111)
GFR calc Af Amer: >60 mL/min (ref >60)
GFR calc non Af Amer: >60 mL/min (ref >60)
GLUCOSE: 123 mg/dL — AB (ref 65–99)
Potassium: 3.6 mmol/L (ref 3.5–5.1)
SODIUM: 141 mmol/L (ref 135–145)
Total Bilirubin: 0.9 mg/dL (ref 0.3–1.2)
Total Protein: 7.7 g/dL (ref 6.5–8.1)

## 2015-02-07 LAB — RAPID URINE DRUG SCREEN, HOSP PERFORMED
Amphetamines: NOT DETECTED
BARBITURATES: NOT DETECTED
Benzodiazepines: POSITIVE — AB
COCAINE: NOT DETECTED
Opiates: NOT DETECTED
TETRAHYDROCANNABINOL: NOT DETECTED

## 2015-02-07 LAB — ETHANOL: Alcohol, Ethyl (B): <5 mg/dL (ref <5)

## 2015-02-07 LAB — CBG MONITORING, ED: Glucose-Capillary: 118 mg/dL — ABNORMAL HIGH (ref 65–99)

## 2015-02-07 LAB — SALICYLATE LEVEL: Salicylate Lvl: <4 mg/dL (ref 2.8–30.0)

## 2015-02-07 MED ORDER — LOPERAMIDE HCL 2 MG PO CAPS: 2.0000 mg | ORAL_CAPSULE | ORAL | Status: DC | PRN

## 2015-02-07 MED ORDER — ACETAMINOPHEN 325 MG PO TABS: 650.0000 mg | ORAL_TABLET | Freq: Four times a day (QID) | ORAL | Status: DC | PRN

## 2015-02-07 MED ORDER — ADULT MULTIVITAMIN W/MINERALS CH
1.0000 | ORAL_TABLET | Freq: Every day | ORAL | Status: DC
  Administered 2015-02-07 – 2015-02-10 (×4): 1 via ORAL
  Filled 2015-02-07 (×6): qty 1

## 2015-02-07 MED ORDER — THIAMINE HCL 100 MG/ML IJ SOLN
100.0000 mg | Freq: Once | INTRAMUSCULAR | Status: AC
  Administered 2015-02-07: 100 mg via INTRAMUSCULAR
  Filled 2015-02-07: qty 2

## 2015-02-07 MED ORDER — HYDROXYZINE HCL 25 MG PO TABS
25.0000 mg | ORAL_TABLET | Freq: Four times a day (QID) | ORAL | Status: DC | PRN
  Administered 2015-02-07 – 2015-02-08 (×2): 25 mg via ORAL
  Filled 2015-02-07 (×2): qty 1

## 2015-02-07 MED ORDER — ALUM & MAG HYDROXIDE-SIMETH 200-200-20 MG/5ML PO SUSP: 30.0000 mL | ORAL | Status: DC | PRN

## 2015-02-07 MED ORDER — VITAMIN B-1 100 MG PO TABS
100.0000 mg | ORAL_TABLET | Freq: Every day | ORAL | Status: DC
  Administered 2015-02-08 – 2015-02-10 (×3): 100 mg via ORAL
  Filled 2015-02-07 (×5): qty 1

## 2015-02-07 MED ORDER — ONDANSETRON HCL 4 MG PO TABS: 4.0000 mg | ORAL_TABLET | Freq: Three times a day (TID) | ORAL | Status: DC | PRN

## 2015-02-07 MED ORDER — ONDANSETRON 4 MG PO TBDP: 4.0000 mg | ORAL_TABLET | Freq: Four times a day (QID) | ORAL | Status: DC | PRN

## 2015-02-07 MED ORDER — LORAZEPAM 1 MG PO TABS: 1.0000 mg | ORAL_TABLET | Freq: Four times a day (QID) | ORAL | Status: DC | PRN

## 2015-02-07 MED ORDER — ONDANSETRON HCL 4 MG PO TABS
4.0000 mg | ORAL_TABLET | Freq: Three times a day (TID) | ORAL | Status: DC | PRN
  Administered 2015-02-07: 4 mg via ORAL
  Filled 2015-02-07: qty 1

## 2015-02-07 MED ORDER — MAGNESIUM HYDROXIDE 400 MG/5ML PO SUSP: 30.0000 mL | Freq: Every day | ORAL | Status: DC | PRN

## 2015-02-07 MED ORDER — MIRTAZAPINE 7.5 MG PO TABS
7.5000 mg | ORAL_TABLET | Freq: Every day | ORAL | Status: DC
  Administered 2015-02-07 – 2015-02-09 (×3): 7.5 mg via ORAL
  Filled 2015-02-07 (×5): qty 1

## 2015-02-07 NOTE — H&P (Signed)
[facility] OBS UNIT H&P   Patient Identification: Andrew Burns MRN:  [id number] Principal Diagnosis: MDD (major depressive disorder), recurrent severe, without psychosis ([place]) Diagnosis:   Patient Active Problem List   Diagnosis Date Noted  . Alcohol abuse [F10.10] 02/07/2015    Priority: High  . Benzodiazepine abuse [F13.10] 02/07/2015    Priority: High  . MDD (major depressive disorder), recurrent severe, without psychosis ([place]) [F33.2] 02/07/2015    Priority: High  . Dizziness [R42] 07/25/2012  . Essential hypertension [I10] 07/25/2012  . GERD (gastroesophageal reflux disease) [K21.9] 07/04/2012  . Chest pain [R07.9] 07/03/2012    Total Time spent with patient: 45 minutes  Subjective:   Andrew Burns is a 55 y.o. male patient admitted with reports of alcohol vs. Benzo intoxication with possible suicidal ideation. Pt seen and chart reviewed. Pt is alert/oriented x4, calm, cooperative, and appropriate to situation. Pt denies suicidal/homicidal ideation and psychosis and does not appear to be responding to internal stimuli. Pt reports that he has been feeling overwhelmed lately and would like something for sleep and depression as well as resources for counseling/psychiatry. Pt recants his suicidal statements and is receptive to staying in [place] overnight.   HPI:  Andrew Burns is an 55 y.o. male who was brought to [facility] by his Work Librarian, academic. The Patient presented tearful, depressed, smelling of alcohol, and orientated x3. The Patient reports getting 5 Xanax tablets from a Friend on Friday, 02-05-2015, evening and taking them with Shearon Stalls with the intent of taking his life. He reports waking up Saturday, 02-06-2015, drinking the remaining contents of the 5th of Shearon Stalls and then going to work early and telling his Supervisor "I can't do this anymore." The Patient was asked if he is currently experiencing SI and he reports "I don't want to be here anymore." The  Patient denied HI and AVH. He reports experiencing the following symptoms for the past week: 4 hours of broken sleep, racing thoughts, feeling overwhelmed, no appetite, episodes of crying, and feeling hopeless and helpless. The Patient reports going through a divorce the past 3 years and shortly it will be final, not seeing or knowing the whereabouts of his 45 yr old handicap Son who resides with his Spouse, and not spending time with28 year old Daughter who has current legal charges. Patient's Supervisor, Federico Flake, provided collateral information. Ms. Sharlet Salina reports for the past 3 to 4 months the Patient has been sad and depressed, and missing work or coming to work and not really working. Ms. Sharlet Salina reports the Patient told her last night, 02-06-2015, "I have no purpose in life now." Ms. Sharlet Salina reports being concerned and driving the Patient to [facility]. The Patient reports being previously hospitalized at [facility] in 2016 for SI w/plan to OD on Tylenol for 2 weeks. Patient reports being prescribed Cymbalta by PCP for depression but he has not been med adherent. The Patient reports needing someone to talk to and be prescribed medication that will be beneficial for depression.  Past Psychiatric History: ETOH abuse  Risk to Self: Suicidal Ideation: Yes-Currently Present Suicidal Intent: No-Not Currently/Within Last 6 Months Is patient at risk for suicide?: Yes Suicidal Plan?: No-Not Currently/Within Last 6 Months Access to Means: No What has been your use of drugs/alcohol within the last 12 months?: ETOH and Xanax How many times?: 1 Other Self Harm Risks: None Triggers for Past Attempts: Spouse contact (Pending divorce and estrangement from handicap Son) Intentional Self Injurious Behavior: None Risk  to Others: Homicidal Ideation: No Thoughts of Harm to Others: No Current Homicidal Intent: No Current Homicidal Plan: No Access to Homicidal Means:  No Identified Victim: N/A History of harm to others?: No Assessment of Violence: None Noted Violent Behavior Description: N/A Does patient have access to weapons?: No (Patient denied owning a gun or having access) Criminal Charges Pending?: No Does patient have a court date: No Prior Inpatient Therapy: Prior Inpatient Therapy: Yes Prior Therapy Dates: 2016 Prior Therapy Facilty/Provider(s): [facility] Reason for Treatment: SI w/plan to OD on Tylenol Prior Outpatient Therapy: Prior Outpatient Therapy: No Prior Therapy Dates: N/A Prior Therapy Facilty/Provider(s): N/A Reason for Treatment: N/A Does patient have an ACCT team?: No Does patient have Intensive In-House Services?  : No Does patient have [facility] services? : No Does patient have P4CC services?: No  Past Medical History:  Past Medical History  Diagnosis Date  . Hypertension   . Hyperlipemia   . Overweight   . Kidney stones     "no surgeries; have plenty in me" (07/03/2012)  . GERD (gastroesophageal reflux disease)   . H/O cardiac catheterization     Normal coronaries with normal EF 06/2012    Past Surgical History  Procedure Laterality Date  . Inguinal hernia repair Right ~ 2012  . Kidney surgery Right ~ 2005    tumor removed from kidney- benign  . Left heart catheterization with coronary angiogram N/A 07/04/2012    Procedure: LEFT HEART CATHETERIZATION WITH CORONARY ANGIOGRAM;  Surgeon: Burnell Blanks, MD;  Location: [facility] CATH LAB;  Service: Cardiovascular;  Laterality: N/A;   Family History:  Family History  Problem Relation Age of Onset  . Stroke Father     multiple - first in his 4's, died @ 50  . Heart attack Mother     unclear timing - she is alive and well @ 18  . Other Brother     4 brothers - alive and well  . Other Sister     2 sisters - alive and well  . Heart attack Sister     died in her 37's, ? MI/drug abuse.   Family Psychiatric  History: Unknown Social History:  History   Alcohol Use  . 3.6 oz/week  . 6 Cans of beer per week    Comment: 07/03/2012 "1-2 cans beer qod"     History  Drug Use No    Social History   Social History  . Marital Status: Legally Separated    Spouse Name: N/A  . Number of Children: N/A  . Years of Education: N/A   Social History Main Topics  . Smoking status: Never Smoker   . Smokeless tobacco: Never Used  . Alcohol Use: 3.6 oz/week    6 Cans of beer per week     Comment: 07/03/2012 "1-2 cans beer qod"  . Drug Use: No  . Sexual Activity: Not Currently   Other Topics Concern  . None   Social History Narrative   Lives in [place].  He is married but separated.  His two children are staying with him.  His son is 14 but handicapped ("mind of a 32 yr old).  His dtr is 15.   Additional Social History:                          Allergies:  No Known Allergies  Labs:  Results for orders placed or performed during the hospital encounter of 02/07/15 (from  the past 48 hour(s))  CBG monitoring, ED     Status: Abnormal   Collection Time: 02/07/15  3:02 AM  Result Value Ref Range   Glucose-Capillary 118 (H) 65 - 99 mg/dL  Comprehensive metabolic panel     Status: Abnormal   Collection Time: 02/07/15  3:08 AM  Result Value Ref Range   Sodium 141 135 - 145 mmol/L   Potassium 3.6 3.5 - 5.1 mmol/L   Chloride 102 101 - 111 mmol/L   CO2 27 22 - 32 mmol/L   Glucose, Bld 123 (H) 65 - 99 mg/dL   BUN 22 (H) 6 - 20 mg/dL   Creatinine, Ser 0.88 0.61 - 1.24 mg/dL   Calcium 10.0 8.9 - 10.3 mg/dL   Total Protein 7.7 6.5 - 8.1 g/dL   Albumin 4.7 3.5 - 5.0 g/dL   AST 25 15 - 41 U/L   ALT 40 17 - 63 U/L   Alkaline Phosphatase 64 38 - 126 U/L   Total Bilirubin 0.9 0.3 - 1.2 mg/dL   GFR calc non Af Amer >60 >60 mL/min   GFR calc Af Amer >60 >60 mL/min    Comment: (NOTE) The eGFR has been calculated using the CKD EPI equation. This calculation has not been validated in all clinical situations. eGFR's persistently <60  mL/min signify possible Chronic Kidney Disease.    Anion gap 12 5 - 15  CBC     Status: Abnormal   Collection Time: 02/07/15  3:08 AM  Result Value Ref Range   WBC 10.4 4.0 - 10.5 K/uL   RBC 5.43 4.22 - 5.81 MIL/uL   Hemoglobin 17.6 (H) 13.0 - 17.0 g/dL   HCT 48.6 39.0 - 52.0 %   MCV 89.5 78.0 - 100.0 fL   MCH 32.4 26.0 - 34.0 pg   MCHC 36.2 (H) 30.0 - 36.0 g/dL   RDW 12.8 11.5 - 15.5 %   Platelets 249 150 - 400 K/uL  Ethanol (ETOH)     Status: None   Collection Time: 02/07/15  3:09 AM  Result Value Ref Range   Alcohol, Ethyl (B) <5 <5 mg/dL    Comment:        LOWEST DETECTABLE LIMIT FOR SERUM ALCOHOL IS 5 mg/dL FOR MEDICAL PURPOSES ONLY   Salicylate level     Status: None   Collection Time: 02/07/15  3:09 AM  Result Value Ref Range   Salicylate Lvl <2.4 2.8 - 30.0 mg/dL  Acetaminophen level     Status: Abnormal   Collection Time: 02/07/15  3:09 AM  Result Value Ref Range   Acetaminophen (Tylenol), Serum <10 (L) 10 - 30 ug/mL    Comment:        THERAPEUTIC CONCENTRATIONS VARY SIGNIFICANTLY. A RANGE OF 10-30 ug/mL MAY BE AN EFFECTIVE CONCENTRATION FOR MANY PATIENTS. HOWEVER, SOME ARE BEST TREATED AT CONCENTRATIONS OUTSIDE THIS RANGE. ACETAMINOPHEN CONCENTRATIONS >150 ug/mL AT 4 HOURS AFTER INGESTION AND >50 ug/mL AT 12 HOURS AFTER INGESTION ARE OFTEN ASSOCIATED WITH TOXIC REACTIONS.   Urine rapid drug screen (hosp performed) (Not at [facility])     Status: Abnormal   Collection Time: 02/07/15  7:27 AM  Result Value Ref Range   Opiates NONE DETECTED NONE DETECTED   Cocaine NONE DETECTED NONE DETECTED   Benzodiazepines POSITIVE (A) NONE DETECTED   Amphetamines NONE DETECTED NONE DETECTED   Tetrahydrocannabinol NONE DETECTED NONE DETECTED   Barbiturates NONE DETECTED NONE DETECTED    Comment:        DRUG  SCREEN FOR MEDICAL PURPOSES ONLY.  IF CONFIRMATION IS NEEDED FOR ANY PURPOSE, NOTIFY LAB WITHIN 5 DAYS.        LOWEST DETECTABLE LIMITS FOR URINE DRUG  SCREEN Drug Class       Cutoff (ng/mL) Amphetamine      1000 Barbiturate      200 Benzodiazepine   660 Tricyclics       630 Opiates          300 Cocaine          300 THC              50     Current Facility-Administered Medications  Medication Dose Route Frequency Provider Last Rate Last Dose  . acetaminophen (TYLENOL) tablet 650 mg  650 mg Oral Q6H PRN Delfin Gant, NP      . alum & mag hydroxide-simeth (MAALOX/MYLANTA) 200-200-20 MG/5ML suspension 30 mL  30 mL Oral Q4H PRN Delfin Gant, NP      . hydrOXYzine (ATARAX/VISTARIL) tablet 25 mg  25 mg Oral Q6H PRN Benjamine Mola, FNP      . loperamide (IMODIUM) capsule 2-4 mg  2-4 mg Oral PRN Benjamine Mola, FNP      . LORazepam (ATIVAN) tablet 1 mg  1 mg Oral Q6H PRN Benjamine Mola, FNP      . magnesium hydroxide (MILK OF MAGNESIA) suspension 30 mL  30 mL Oral Daily PRN Delfin Gant, NP      . mirtazapine (REMERON) tablet 7.5 mg  7.5 mg Oral QHS Elyse Jarvis Withrow, FNP      . multivitamin with minerals tablet 1 tablet  1 tablet Oral Daily John C Withrow, FNP      . ondansetron (ZOFRAN) tablet 4 mg  4 mg Oral Q8H PRN Delfin Gant, NP      . ondansetron (ZOFRAN-ODT) disintegrating tablet 4 mg  4 mg Oral Q6H PRN Benjamine Mola, FNP      . thiamine (B-1) injection 100 mg  100 mg Intramuscular Once Benjamine Mola, FNP      . [START ON 02/08/2015] thiamine (VITAMIN B-1) tablet 100 mg  100 mg Oral Daily Benjamine Mola, FNP        Musculoskeletal: Strength & Muscle Tone: within normal limits Gait & Station: normal Patient leans: N/A  Psychiatric Specialty Exam: Review of Systems  Psychiatric/Behavioral: Positive for depression and substance abuse. Negative for suicidal ideas. The patient is nervous/anxious and has insomnia.   All other systems reviewed and are negative.   Blood pressure 146/103, pulse 76, temperature 97.9 F (36.6 C), temperature source Oral, resp. rate 20, height 5' 6.5" (1.689 m), weight 80.287 kg  (177 lb), SpO2 100 %.Body mass index is 28.14 kg/(m^2).  General Appearance: Casual and Fairly Groomed  Engineer, water::  Good  Speech:  Clear and Coherent and Normal Rate  Volume:  Normal  Mood:  Anxious and Depressed  Affect:  Appropriate and Congruent  Thought Process:  Coherent and Goal Directed  Orientation:  Full (Time, Place, and Person)  Thought Content:  WDL  Suicidal Thoughts:  No  Homicidal Thoughts:  No  Memory:  Immediate;   Fair Recent;   Fair Remote;   Fair  Judgement:  Fair  Insight:  Fair  Psychomotor Activity:  Normal  Concentration:  Fair  Recall:  [employer] [place]  Language: Fair  Akathisia:  No  Handed:    AIMS (if indicated):  Assets:  Communication Skills Desire for Improvement Physical Health Resilience Social Support  ADL's:  Intact  Cognition: WNL  Sleep:      Treatment Plan Summary:  Daily contact with patient to assess and evaluate symptoms and progress in treatment and Medication management   -Remeron 7.74m qhs for insomnia/MDD -CIWA Ativan protocol, PRN only, not scheduled  [facility]: [facility] [place] [facility] [place] for [facility] and stabilization  WBenjamine Mola FNP-BC 02/07/2015 5:45 PM  Case discussed with me as above

## 2015-02-07 NOTE — Progress Notes (Signed)
Nursing Admission Note:  Patient is voluntary admission from [facility] with Diagnosis of MDD, Recurrent, Severe and Alcohol Abuse D/O. Patient is cooperative, very quiet, answers questions soft and mechanically, frequently weeping, states "I was at work and I had to leave, decided to call it quits"; Nurse asking for clarification as to if he meant his job or his entire life or what exactly. Pt responds "I just left work early and i just don't know". Pt admits to taking 5 xanax tablets and drinking a bottle of alcohol, shakes head "yes" when asked if he intended to harm himself. Nurse asking him if he was angry or relieved when he woke up and pt shrugs shoulders and states "im so tired of being lonely", and states his boss as his support person.Pt also stating that ordinarily  "I don't ever drink at all".  Pt has a grown son who is disabled and is with his mother (pt's estranged wife;divorce will be final sometime this year) and patient states "I don't know where they live". Pt also states he has a grown daughter "who has given me nothing but heartache again and again" and is apparently estranged from her at this time. Pt states his goal for this admission is to "just try to cope with my problems' but is sobbing and Nurse not pressuring him to elaborate. Pt denying any active SI/HI/AVH and does verbally contract for safety and agrees to alert staff should he develop any plans to harm himself before doing so.

## 2015-02-07 NOTE — ED Provider Notes (Signed)
CSN: [id number]     Arrival date & time 02/07/15  0217 History   First MD Initiated Contact with Patient 02/07/15 0251     Chief Complaint  Patient presents with  . Suicide Attempt     (Consider location/radiation/quality/duration/timing/severity/associated sxs/prior Treatment) HPI Comments: Patient reports increasing depression with history of the same. He stopped taking his medications 2 days ago. Tonight he felt increasingly despondent and took 5 Xanax of unknown strength and alcohol in a suicidal gesture. He reports he took the medication around 10:00 pm. No vomiting. He reports history of suicide attempt in the past.   The history is provided by the patient. No language interpreter was used.    Past Medical History  Diagnosis Date  . Hypertension   . Hyperlipemia   . Overweight   . Kidney stones     "no surgeries; have plenty in me" (07/03/2012)  . GERD (gastroesophageal reflux disease)   . H/O cardiac catheterization     Normal coronaries with normal EF 06/2012   Past Surgical History  Procedure Laterality Date  . Inguinal hernia repair Right ~ 2012  . Kidney surgery Right ~ 2005    tumor removed from kidney- benign  . Left heart catheterization with coronary angiogram N/A 07/04/2012    Procedure: LEFT HEART CATHETERIZATION WITH CORONARY ANGIOGRAM;  Surgeon: [name], MD;  Location: [facility] CATH LAB;  Service: Cardiovascular;  Laterality: N/A;   Family History  Problem Relation Age of Onset  . Stroke Father     multiple - first in his 79's, died @ 109  . Heart attack Mother     unclear timing - she is alive and well @ 41  . Other Brother     4 brothers - alive and well  . Other Sister     2 sisters - alive and well  . Heart attack Sister     died in her 19's, ? MI/drug abuse.   Social History  Substance Use Topics  . Smoking status: Never Smoker   . Smokeless tobacco: Never Used  . Alcohol Use: 3.6 oz/week    6 Cans of beer per week     Comment:  07/03/2012 "1-2 cans beer qod"    Review of Systems  Constitutional: Negative for fever and chills.  HENT: Negative.   Respiratory: Negative.   Cardiovascular: Negative.   Gastrointestinal: Negative.   Musculoskeletal: Negative.   Skin: Negative.   Neurological: Negative.   Psychiatric/Behavioral: Positive for suicidal ideas and dysphoric mood.      Allergies  Review of patient's allergies indicates no known allergies.  Home Medications   Prior to Admission medications   Medication Sig Start Date End Date Taking? Authorizing Provider  aspirin EC 81 MG tablet Take 81 mg by mouth daily.    Historical Provider, MD  atorvastatin (LIPITOR) 20 MG tablet Take 20 mg by mouth at bedtime.    Historical Provider, MD  fluticasone (FLONASE) 50 MCG/ACT nasal spray Place 2 sprays into the nose at bedtime.    Historical Provider, MD  metoprolol succinate (TOPROL-XL) 50 MG 24 hr tablet TAKE 1 TABLET BY MOUTH EVERY DAY 07/10/12   Historical Provider, MD  olmesartan (BENICAR) 20 MG tablet Take 20 mg by mouth daily.    Historical Provider, MD  pantoprazole (PROTONIX) 40 MG tablet  07/14/12   [name], MD  vitamin E 200 UNIT capsule Take by mouth. Take 200 Units by mouth daily.    Historical Provider, MD  BP 141/94 mmHg  Pulse 103  Temp(Src) 97.9 F (36.6 C) (Oral)  Resp 18  SpO2 100% Physical Exam  Constitutional: He appears well-developed and well-nourished.  HENT:  Head: Normocephalic.  Neck: Normal range of motion. Neck supple.  Cardiovascular: Normal rate and regular rhythm.   Pulmonary/Chest: Effort normal and breath sounds normal.  Abdominal: Soft. Bowel sounds are normal. There is no tenderness. There is no rebound and no guarding.  Musculoskeletal: Normal range of motion.  Neurological: He is alert. No cranial nerve deficit.  Skin: Skin is warm and dry. No rash noted.  Psychiatric: He is withdrawn. He exhibits a depressed mood. He expresses suicidal ideation. He  expresses suicidal plans.    ED Course  Procedures (including critical care time) Labs Review Labs Reviewed  COMPREHENSIVE METABOLIC PANEL  ETHANOL  SALICYLATE LEVEL  ACETAMINOPHEN LEVEL  CBC  URINE RAPID DRUG SCREEN, HOSP PERFORMED  CBG MONITORING, ED   Results for orders placed or performed during the hospital encounter of 02/07/15  Comprehensive metabolic panel  Result Value Ref Range   Sodium 141 135 - 145 mmol/L   Potassium 3.6 3.5 - 5.1 mmol/L   Chloride 102 101 - 111 mmol/L   CO2 27 22 - 32 mmol/L   Glucose, Bld 123 (H) 65 - 99 mg/dL   BUN 22 (H) 6 - 20 mg/dL   Creatinine, Ser 9.62 0.61 - 1.24 mg/dL   Calcium 95.2 8.9 - 84.1 mg/dL   Total Protein 7.7 6.5 - 8.1 g/dL   Albumin 4.7 3.5 - 5.0 g/dL   AST 25 15 - 41 U/L   ALT 40 17 - 63 U/L   Alkaline Phosphatase 64 38 - 126 U/L   Total Bilirubin 0.9 0.3 - 1.2 mg/dL   GFR calc non Af Amer >60 >60 mL/min   GFR calc Af Amer >60 >60 mL/min   Anion gap 12 5 - 15  Ethanol (ETOH)  Result Value Ref Range   Alcohol, Ethyl (B) <5 <5 mg/dL  Salicylate level  Result Value Ref Range   Salicylate Lvl <4.0 2.8 - 30.0 mg/dL  Acetaminophen level  Result Value Ref Range   Acetaminophen (Tylenol), Serum <10 (L) 10 - 30 ug/mL  CBC  Result Value Ref Range   WBC 10.4 4.0 - 10.5 K/uL   RBC 5.43 4.22 - 5.81 MIL/uL   Hemoglobin 17.6 (H) 13.0 - 17.0 g/dL   HCT 32.4 40.1 - 02.7 %   MCV 89.5 78.0 - 100.0 fL   MCH 32.4 26.0 - 34.0 pg   MCHC 36.2 (H) 30.0 - 36.0 g/dL   RDW 25.3 66.4 - 40.3 %   Platelets 249 150 - 400 K/uL  CBG monitoring, ED  Result Value Ref Range   Glucose-Capillary 118 (H) 65 - 99 mg/dL     Imaging Review No results found. I have personally reviewed and evaluated these images and lab results as part of my medical decision-making.   EKG Interpretation None      MDM   Final diagnoses:  None    1. Suicidal ideation with gesture 2. Depression  The patient will be cleared with labs and observation.  Currently awake, alert, VSS.   Patient is medically cleared. TTS consultation is requested and pending.     Elpidio Anis, PA-C 02/07/15 [id number]  Elpidio Anis, PA-C 02/07/15 [id number]  Lorre Nick, MD 02/08/15 [phone]

## 2015-02-07 NOTE — ED Notes (Signed)
Spoke to [place] at [facility] re pt room assignment.  She states that she is waiting for the MD to discuss plan of care. And will call back with bed assignment.

## 2015-02-07 NOTE — BH Assessment (Signed)
Assessment Note  Andrew Burns is an 55 y.o. male who was brought to [facility] by his Work Merchandiser, retail.  The Patient presented tearful, depressed, smelling of alcohol, and orientated x3.  The Patient reports getting 5 Xanax tablets from a Friend on Friday, 02-05-2015, evening and taking them with Andrew Burns with the intent of taking his life.  He reports waking up Saturday, 02-06-2015, drinking the remaining contents of the 5th of Andrew Burns  and then going to work early and telling his Supervisor "I can't do this anymore."   The Patient was asked if he is currently experiencing SI and he reports "I don't want to be here anymore."  The Patient denied HI and AVH.  He reports experiencing the following symptoms for the past week:  4 hours of broken sleep, racing thoughts, feeling overwhelmed, no appetite, episodes of crying, and feeling hopeless and helpless.  The Patient reports going through a divorce the past 3 years and shortly it will be final, not seeing or knowing the whereabouts of his 54 yr old handicap Son who resides with his Spouse, and not spending time with35 year old Daughter who has current legal charges.  Patient's Supervisor, Andrew Burns, provided collateral information.  Ms. Andrew Burns reports for the past 3 to 4 months the Patient has been sad and depressed, and missing work or coming to work and not really working.  Ms. Andrew Burns reports the Patient told her last night, 02-06-2015, "I have no purpose in life now."  Ms. Andrew Burns reports being concerned and driving the Patient to [facility].  The Patient reports being previously hospitalized at [facility] in 2016 for SI w/plan to OD on Tylenol for 2 weeks.  Patient reports being prescribed Cymbalta by PCP for depression but he has not been med adherent.  The Patient reports needing someone to talk to and be prescribed medication that will be beneficial for depression.                Diagnosis: Major Depressive Disorder,  recurrent, severe Alcohol Use Disorder, mild  Past Medical History:  Past Medical History  Diagnosis Date  . Hypertension   . Hyperlipemia   . Overweight   . Kidney stones     "no surgeries; have plenty in me" (07/03/2012)  . GERD (gastroesophageal reflux disease)   . H/O cardiac catheterization     Normal coronaries with normal EF 06/2012    Past Surgical History  Procedure Laterality Date  . Inguinal hernia repair Right ~ 2012  . Kidney surgery Right ~ 2005    tumor removed from kidney- benign  . Left heart catheterization with coronary angiogram N/A 07/04/2012    Procedure: LEFT HEART CATHETERIZATION WITH CORONARY ANGIOGRAM;  Surgeon: [name], MD;  Location: [facility] CATH LAB;  Service: Cardiovascular;  Laterality: N/A;    Family History:  Family History  Problem Relation Age of Onset  . Stroke Father     multiple - first in his 75's, died @ 54  . Heart attack Mother     unclear timing - she is alive and well @ 12  . Other Brother     4 brothers - alive and well  . Other Sister     2 sisters - alive and well  . Heart attack Sister     died in her 22's, ? MI/drug abuse.    Social History:  reports that he has never smoked. He has never used smokeless tobacco. He reports that  he drinks about 3.6 oz of alcohol per week. He reports that he does not use illicit drugs.  Additional Social History:     CIWA:   COWS:    Allergies: No Known Allergies  Home Medications:  (Not in a hospital admission)  OB/GYN Status:  No LMP for male patient.  General Assessment Data Location of Assessment: [facility] TTS Assessment: In system Is this a Tele or Face-to-Face Assessment?: Face-to-Face Is this an Initial Assessment or a Re-assessment for this encounter?: Initial Assessment Marital status: Married (living separately from Spouse for 3 years) [place] name: N/A Is patient pregnant?: No Pregnancy Status: No Living Arrangements: Non-relatives/Friends  (Roommate) Can pt return to current living arrangement?: Yes Admission Status: Voluntary Is patient capable of signing voluntary admission?: Yes Referral Source: Self/Family/Friend Museum/gallery exhibitions officer) Insurance type: Nurse, adult Exam [facility] Walk-in ONLY) Medical Exam completed: No Reason for MSE not completed: Other: ([facility] walk in)  Crisis Care Plan Living Arrangements: Non-relatives/Friends Nurse, mental health) Legal Guardian: Other: (Patient own guardian) Name of Psychiatrist: None Name of Therapist: None  Education Status Is patient currently in school?: No Current Grade: N/A Highest grade of school patient has completed: 12th Name of school: N/A Contact person: N/A  Risk to self with the past 6 months Suicidal Ideation: Yes-Currently Present Has patient been a risk to self within the past 6 months prior to admission? : Yes Suicidal Intent: No-Not Currently/Within Last 6 Months Has patient had any suicidal intent within the past 6 months prior to admission? : Yes Is patient at risk for suicide?: Yes Suicidal Plan?: No-Not Currently/Within Last 6 Months Has patient had any suicidal plan within the past 6 months prior to admission? : Yes Access to Means: No What has been your use of drugs/alcohol within the last 12 months?: ETOH and Xanax Previous Attempts/Gestures: Yes How many times?: 1 Other Self Harm Risks: None Triggers for Past Attempts: Spouse contact (Pending divorce and estrangement from handicap Son) Intentional Self Injurious Behavior: None Family Suicide History: Unknown Recent stressful life event(s): Divorce, Conflict (Comment) (Pending divorce, estrangement from Son, adult Daughter legal) Persecutory voices/beliefs?: No Depression: Yes Depression Symptoms: Insomnia, Tearfulness, Despondent, Feeling worthless/self pity Substance abuse history and/or treatment for substance abuse?: Yes (ETOH and Xanax) Suicide prevention information given to non-admitted  patients: Not applicable  Risk to Others within the past 6 months Homicidal Ideation: No Does patient have any lifetime risk of violence toward others beyond the six months prior to admission? : No Thoughts of Harm to Others: No Current Homicidal Intent: No Current Homicidal Plan: No Access to Homicidal Means: No Identified Victim: N/A History of harm to others?: No Assessment of Violence: None Noted Violent Behavior Description: N/A Does patient have access to weapons?: No (Patient denied owning a gun or having access) Criminal Charges Pending?: No Does patient have a court date: No Is patient on probation?: No  Psychosis Hallucinations: None noted Delusions: None noted  Mental Status Report Appearance/Hygiene: Unremarkable Eye Contact: Fair Motor Activity: Other (Comment) (Patient unsturdy on feet) Speech: Logical/coherent Level of Consciousness: Alert Mood: Depressed, Sad Affect: Depressed, Sad Anxiety Level: Moderate Thought Processes: Coherent, Relevant Judgement: Impaired Orientation: Person, Place, Time, Situation Obsessive Compulsive Thoughts/Behaviors: None  Cognitive Functioning Concentration: Decreased Memory: Recent Intact, Remote Intact IQ: Average Insight: Poor Impulse Control: Poor Appetite: Poor Weight Loss: 10 Weight Gain: 0 Sleep: Decreased Total Hours of Sleep: 4 Vegetative Symptoms: None  ADLScreening [facility] Assessment Services) Patient's cognitive ability adequate to safely complete daily activities?:  Yes Patient able to express need for assistance with ADLs?: Yes Independently performs ADLs?: Yes (appropriate for developmental age)  Prior Inpatient Therapy Prior Inpatient Therapy: Yes Prior Therapy Dates: 2016 Prior Therapy Facilty/Provider(s): [facility] Reason for Treatment: SI w/plan to OD on Tylenol  Prior Outpatient Therapy Prior Outpatient Therapy: No Prior Therapy Dates: N/A Prior Therapy Facilty/Provider(s):  N/A Reason for Treatment: N/A Does patient have an ACCT team?: No Does patient have Intensive In-House Services?  : No Does patient have [facility] services? : No Does patient have P4CC services?: No  ADL Screening (condition at time of admission) Patient's cognitive ability adequate to safely complete daily activities?: Yes Is the patient deaf or have difficulty hearing?: No Does the patient have difficulty seeing, even when wearing glasses/contacts?: No Does the patient have difficulty concentrating, remembering, or making decisions?: No Patient able to express need for assistance with ADLs?: Yes Does the patient have difficulty dressing or bathing?: No Independently performs ADLs?: Yes (appropriate for developmental age) Does the patient have difficulty walking or climbing stairs?: No Weakness of Legs: None Weakness of Arms/Hands: None  Home Assistive Devices/Equipment Home Assistive Devices/Equipment: None    Abuse/Neglect Assessment (Assessment to be complete while patient is alone) Physical Abuse: Denies Verbal Abuse: Denies Sexual Abuse: Denies Exploitation of patient/patient's resources: Denies Self-Neglect: Denies Values / Beliefs Cultural Requests During Hospitalization: None Spiritual Requests During Hospitalization: None        Additional Information 1:1 In Past 12 Months?: No CIRT Risk: No Elopement Risk: No Does patient have medical clearance?: No     Disposition:  Disposition Initial Assessment Completed for this Encounter: Yes Disposition of Patient: Other dispositions (WLED for medical clearance) Other disposition(s): Other (Comment) (need medical clearance)  On Site Evaluation by:   Reviewed with Physician:    Dey-Johnson,Briggs Edelen 02/07/2015 2:07 AM

## 2015-02-07 NOTE — Progress Notes (Addendum)
CSW met with pt individually to discuss aftercare plan. Pt depressed with flat affect during interaction. Pt states that he lives in [place] with his roommate and is able to return home and back to work "at the [employer]." Pt states that he goes to [place] for medication management (sees PCP). Pt receptive to receiving information to outpatient mental health providers that accept private insurance (psychiatry and counseling) and information about Inpatient option [facility]). CSW encouraged pt to read over information to explore his options and check in with night time Education officer, museum.   Conrad NP met with pt this morning and recommends observation overnight and re-evaluation in the morning.   Maxie Better, MSW, LCSW Clinical Social Worker 02/07/2015 6:07 PM

## 2015-02-07 NOTE — ED Notes (Addendum)
Renata Caprice NP [facility] notified re pt's room assignment.  [facility] contacted

## 2015-02-07 NOTE — ED Notes (Signed)
Pt resting comfortably.  Assisted to the BR.  Pt ambulated with steady gait.

## 2015-02-07 NOTE — ED Notes (Signed)
Pt reports taking 5 Xanax and drinking in a suicide attempt.  Pt reports wanting to die.

## 2015-02-08 DIAGNOSIS — R45851 Suicidal ideations: Secondary | ICD-10-CM | POA: Diagnosis not present

## 2015-02-08 DIAGNOSIS — F332 Major depressive disorder, recurrent severe without psychotic features: Secondary | ICD-10-CM | POA: Diagnosis present

## 2015-02-08 DIAGNOSIS — Z823 Family history of stroke: Secondary | ICD-10-CM | POA: Diagnosis not present

## 2015-02-08 DIAGNOSIS — Z87442 Personal history of urinary calculi: Secondary | ICD-10-CM | POA: Diagnosis not present

## 2015-02-08 DIAGNOSIS — Z813 Family history of other psychoactive substance abuse and dependence: Secondary | ICD-10-CM | POA: Diagnosis not present

## 2015-02-08 DIAGNOSIS — G4700 Insomnia, unspecified: Secondary | ICD-10-CM | POA: Diagnosis present

## 2015-02-08 DIAGNOSIS — I10 Essential (primary) hypertension: Secondary | ICD-10-CM | POA: Diagnosis present

## 2015-02-08 DIAGNOSIS — K219 Gastro-esophageal reflux disease without esophagitis: Secondary | ICD-10-CM | POA: Diagnosis present

## 2015-02-08 DIAGNOSIS — F1010 Alcohol abuse, uncomplicated: Secondary | ICD-10-CM | POA: Diagnosis present

## 2015-02-08 DIAGNOSIS — F1310 Sedative, hypnotic or anxiolytic abuse, uncomplicated: Secondary | ICD-10-CM | POA: Diagnosis present

## 2015-02-08 DIAGNOSIS — Z8249 Family history of ischemic heart disease and other diseases of the circulatory system: Secondary | ICD-10-CM | POA: Diagnosis not present

## 2015-02-08 DIAGNOSIS — E785 Hyperlipidemia, unspecified: Secondary | ICD-10-CM | POA: Diagnosis present

## 2015-02-08 DIAGNOSIS — F419 Anxiety disorder, unspecified: Secondary | ICD-10-CM | POA: Diagnosis present

## 2015-02-08 NOTE — Progress Notes (Signed)
Patient sleeping at shift change. Woke up appear sad and depressed. Denies pain, SI, AH/VH. Reports depression. Minimal conversation. Accepted his due medications. Offered support and encouragement as needed. Will continue to monitor patient for safety and stability.

## 2015-02-08 NOTE — Progress Notes (Signed)
Admission note: Patient is a 55 yo male that was brought into [facility] by his supervisor at work.  Patient too 5 xanax tablets from a friend on 1/20.  He took the tablets and drank [name] with the intent of taking his life.  Patient went into work on 1/21 after drinking the rest of the 1/5 of [name].  He told his supervisor that "I just can't take it anymore."  Patient reports poor sleep and appetite, crying spells, hopelessness and helplessness.  Patient is going through a recent divorce.  He has a handicapped son that he does not keep in touch with and his 81 yo daughter has legal charges.  Patient reports "having no purpose in life."  The patient had a prior hospitalization at [facility] in 2016 for SI with plan to overdose.  Patient states he takes 90 mg cymbalta for depression, however, has not taken it in 3 days.  Patient presents with flat, tearful affect; his mood is sad and depressed.  He is slow to answer questions and is currently lying in bed.  He denies any alcohol/drug use.  He is a smoker, but refuses the patch.  He denies HI/AVH.  Patient has med hx of HTN, gerd and left heart catherization.  Patient was oriented to room and unit.

## 2015-02-08 NOTE — BH Assessment (Signed)
[facility] Assessment Progress Note This Clinical research associate and Earlene Plater NP spoke with patient after continued concerns of the patient's emotional state and safety as patient was presented options after discussing patient's case with Earlene Plater NP. This Clinical research associate encouraged patient to consider an inpatient admission per Earlene Plater NP, to the adult unit for further observation and possible medication interventions. Patient stated he would "consider his options" based on whether or not he could contact his partner who is unaware of his whereabouts. This Clinical research associate assisted patient in contacting his partner to inform her of his location and he had agreed to an inpatient admission and will be admitted to 500-1. Patient also signed a release of information to contact patient's EAP to inform of patient disposition and discharge information if needed. Patient is currently employed by the [employer] with Venancio Poisson being patient's EAP and can be reached at [phone].

## 2015-02-08 NOTE — Progress Notes (Signed)
Adult Psychoeducational Group Note  Date:  02/08/2015 Time:  8:29 PM  Group Topic/Focus:  Wrap-Up Group:   The focus of this group is to help patients review their daily goal of treatment and discuss progress on daily workbooks.  Participation Level:  Active  Participation Quality:  Appropriate and Attentive  Affect:  Appropriate  Cognitive:  Appropriate  Insight: Appropriate and Good  Engagement in Group:  Lacking  Modes of Intervention:  Discussion  Additional Comments:  This is pt first day and he mentioned he is stressed. He wants to work on getting out of here by Thursday because he states "my girlfriend has court".   Merlinda Frederick 02/08/2015, 8:29 PM

## 2015-02-08 NOTE — Progress Notes (Signed)
[facility]  Progress Note  02/08/2015 1:38 PM Andrew Burns  MRN:  [id number] Subjective:    Patient states "I am very hopeless. It all started several years ago when I started having problems with my wife. I was pretty good before that. I don't know where my adult handicapped son is. I have had thoughts of killing myself this morning. I tried last year to overdose on tylenol but did not tell anyone. Yesterday I mixed some xanax from a friend with alcohol. Then I went to work the next day drunk. I feel so hopeless about not seeing my son. I can't believe that she is doing this to me."  Objective:   Patient is seen and chart is reviewed. Andrew Burns is very tearful during assessments and is obviously severely depressed. He admits to feelings of hopelessness and continued suicidal thoughts. Patient attempted suicide yesterday and reports an attempt as well last year. He was recently started on Remeron 7.5 mg at bedtime last night to help with sleep and depression. Patient complains of multiple symptoms of depression to include oversleeping, poor energy, loss of pleasure, depressed mood, and suicidal thoughts. Patient requesting to be discharged today but did not appear to be in stable condition. When asked what would stop him from trying to end his life again patient replied "I don't know." Patient is meeting criteria for an inpatient admission at this time.   Principal Problem: MDD (major depressive disorder), recurrent severe, without psychosis ([place]) Diagnosis:   Patient Active Problem List   Diagnosis Date Noted  . Alcohol abuse [F10.10] 02/07/2015  . Benzodiazepine abuse [F13.10] 02/07/2015  . MDD (major depressive disorder), recurrent severe, without psychosis ([employer]) [F33.2] 02/07/2015  . Dizziness [R42] 07/25/2012  . Essential hypertension [I10] 07/25/2012  . GERD (gastroesophageal reflux disease) [K21.9] 07/04/2012  . Chest pain [R07.9] 07/03/2012   Total Time spent with  patient: 30 minutes  Past Psychiatric History: MDD  Past Medical History:  Past Medical History  Diagnosis Date  . Hypertension   . Hyperlipemia   . Overweight   . Kidney stones     "no surgeries; have plenty in me" (07/03/2012)  . GERD (gastroesophageal reflux disease)   . H/O cardiac catheterization     Normal coronaries with normal EF 06/2012    Past Surgical History  Procedure Laterality Date  . Inguinal hernia repair Right ~ 2012  . Kidney surgery Right ~ 2005    tumor removed from kidney- benign  . Left heart catheterization with coronary angiogram N/A 07/04/2012    Procedure: LEFT HEART CATHETERIZATION WITH CORONARY ANGIOGRAM;  Surgeon: Burnell Blanks, MD;  Location: [facility] CATH LAB;  Service: Cardiovascular;  Laterality: N/A;   Family History:  Family History  Problem Relation Age of Onset  . Stroke Father     multiple - first in his 89's, died @ 44  . Heart attack Mother     unclear timing - she is alive and well @ 62  . Other Brother     4 brothers - alive and well  . Other Sister     2 sisters - alive and well  . Heart attack Sister     died in her 21's, ? MI/drug abuse.   Family Psychiatric  History: Patient denies  Social History:  History  Alcohol Use  . 3.6 oz/week  . 6 Cans of beer per week    Comment: 07/03/2012 "1-2 cans beer qod"     History  Drug Use No  Social History   Social History  . Marital Status: Legally Separated    Spouse Name: N/A  . Number of Children: N/A  . Years of Education: N/A   Social History Main Topics  . Smoking status: Never Smoker   . Smokeless tobacco: Never Used  . Alcohol Use: 3.6 oz/week    6 Cans of beer per week     Comment: 07/03/2012 "1-2 cans beer qod"  . Drug Use: No  . Sexual Activity: Not Currently   Other Topics Concern  . None   Social History Narrative   Lives in [place].  He is married but separated.  His two children are staying with him.  His son is 48 but handicapped ("mind of a  33 yr old).  His dtr is 15.   Additional Social History:                         Sleep: Fair  Appetite:  Fair  Current Medications: Current Facility-Administered Medications  Medication Dose Route Frequency Provider Last Rate Last Dose  . acetaminophen (TYLENOL) tablet 650 mg  650 mg Oral Q6H PRN Delfin Gant, NP      . alum & mag hydroxide-simeth (MAALOX/MYLANTA) 200-200-20 MG/5ML suspension 30 mL  30 mL Oral Q4H PRN Delfin Gant, NP      . hydrOXYzine (ATARAX/VISTARIL) tablet 25 mg  25 mg Oral Q6H PRN Benjamine Mola, FNP   25 mg at 02/08/15 1050  . loperamide (IMODIUM) capsule 2-4 mg  2-4 mg Oral PRN Benjamine Mola, FNP      . LORazepam (ATIVAN) tablet 1 mg  1 mg Oral Q6H PRN Benjamine Mola, FNP      . magnesium hydroxide (MILK OF MAGNESIA) suspension 30 mL  30 mL Oral Daily PRN Delfin Gant, NP      . mirtazapine (REMERON) tablet 7.5 mg  7.5 mg Oral QHS Benjamine Mola, FNP   7.5 mg at 02/07/15 2137  . multivitamin with minerals tablet 1 tablet  1 tablet Oral Daily Benjamine Mola, FNP   1 tablet at 02/08/15 0847  . ondansetron (ZOFRAN) tablet 4 mg  4 mg Oral Q8H PRN Delfin Gant, NP      . ondansetron (ZOFRAN-ODT) disintegrating tablet 4 mg  4 mg Oral Q6H PRN Benjamine Mola, FNP      . thiamine (VITAMIN B-1) tablet 100 mg  100 mg Oral Daily Benjamine Mola, FNP   100 mg at 02/08/15 7564    Lab Results:  Results for orders placed or performed during the hospital encounter of 02/07/15 (from the past 48 hour(s))  CBG monitoring, ED     Status: Abnormal   Collection Time: 02/07/15  3:02 AM  Result Value Ref Range   Glucose-Capillary 118 (H) 65 - 99 mg/dL  Comprehensive metabolic panel     Status: Abnormal   Collection Time: 02/07/15  3:08 AM  Result Value Ref Range   Sodium 141 135 - 145 mmol/L   Potassium 3.6 3.5 - 5.1 mmol/L   Chloride 102 101 - 111 mmol/L   CO2 27 22 - 32 mmol/L   Glucose, Bld 123 (H) 65 - 99 mg/dL   BUN 22 (H) 6 - 20 mg/dL    Creatinine, Ser 0.88 0.61 - 1.24 mg/dL   Calcium 10.0 8.9 - 10.3 mg/dL   Total Protein 7.7 6.5 - 8.1 g/dL   Albumin 4.7 3.5 - 5.0  g/dL   AST 25 15 - 41 U/L   ALT 40 17 - 63 U/L   Alkaline Phosphatase 64 38 - 126 U/L   Total Bilirubin 0.9 0.3 - 1.2 mg/dL   GFR calc non Af Amer >60 >60 mL/min   GFR calc Af Amer >60 >60 mL/min    Comment: (NOTE) The eGFR has been calculated using the CKD EPI equation. This calculation has not been validated in all clinical situations. eGFR's persistently <60 mL/min signify possible Chronic Kidney Disease.    Anion gap 12 5 - 15  CBC     Status: Abnormal   Collection Time: 02/07/15  3:08 AM  Result Value Ref Range   WBC 10.4 4.0 - 10.5 K/uL   RBC 5.43 4.22 - 5.81 MIL/uL   Hemoglobin 17.6 (H) 13.0 - 17.0 g/dL   HCT 48.6 39.0 - 52.0 %   MCV 89.5 78.0 - 100.0 fL   MCH 32.4 26.0 - 34.0 pg   MCHC 36.2 (H) 30.0 - 36.0 g/dL   RDW 12.8 11.5 - 15.5 %   Platelets 249 150 - 400 K/uL  Ethanol (ETOH)     Status: None   Collection Time: 02/07/15  3:09 AM  Result Value Ref Range   Alcohol, Ethyl (B) <5 <5 mg/dL    Comment:        LOWEST DETECTABLE LIMIT FOR SERUM ALCOHOL IS 5 mg/dL FOR MEDICAL PURPOSES ONLY   Salicylate level     Status: None   Collection Time: 02/07/15  3:09 AM  Result Value Ref Range   Salicylate Lvl <9.1 2.8 - 30.0 mg/dL  Acetaminophen level     Status: Abnormal   Collection Time: 02/07/15  3:09 AM  Result Value Ref Range   Acetaminophen (Tylenol), Serum <10 (L) 10 - 30 ug/mL    Comment:        THERAPEUTIC CONCENTRATIONS VARY SIGNIFICANTLY. A RANGE OF 10-30 ug/mL MAY BE AN EFFECTIVE CONCENTRATION FOR MANY PATIENTS. HOWEVER, SOME ARE BEST TREATED AT CONCENTRATIONS OUTSIDE THIS RANGE. ACETAMINOPHEN CONCENTRATIONS >150 ug/mL AT 4 HOURS AFTER INGESTION AND >50 ug/mL AT 12 HOURS AFTER INGESTION ARE OFTEN ASSOCIATED WITH TOXIC REACTIONS.   Urine rapid drug screen (hosp performed) (Not at [facility])     Status: Abnormal   Collection  Time: 02/07/15  7:27 AM  Result Value Ref Range   Opiates NONE DETECTED NONE DETECTED   Cocaine NONE DETECTED NONE DETECTED   Benzodiazepines POSITIVE (A) NONE DETECTED   Amphetamines NONE DETECTED NONE DETECTED   Tetrahydrocannabinol NONE DETECTED NONE DETECTED   Barbiturates NONE DETECTED NONE DETECTED    Comment:        DRUG SCREEN FOR MEDICAL PURPOSES ONLY.  IF CONFIRMATION IS NEEDED FOR ANY PURPOSE, NOTIFY LAB WITHIN 5 DAYS.        LOWEST DETECTABLE LIMITS FOR URINE DRUG SCREEN Drug Class       Cutoff (ng/mL) Amphetamine      1000 Barbiturate      200 Benzodiazepine   791 Tricyclics       505 Opiates          300 Cocaine          300 THC              50     Physical Findings: AIMS: Facial and Oral Movements Muscles of Facial Expression: None, normal Lips and Perioral Area: None, normal Jaw: None, normal Tongue: None, normal,Extremity Movements Upper (arms, wrists, hands, fingers): None, normal Lower (  legs, knees, ankles, toes): None, normal, Trunk Movements Neck, shoulders, hips: None, normal, Overall Severity Severity of abnormal movements (highest score from questions above): None, normal Incapacitation due to abnormal movements: None, normal Patient's awareness of abnormal movements (rate only patient's report): No Awareness, Dental Status Current problems with teeth and/or dentures?: No Does patient usually wear dentures?: No  CIWA:  CIWA-Ar Total: 1 COWS:  COWS Total Score: 2  Musculoskeletal: Strength & Muscle Tone: within normal limits Gait & Station: normal Patient leans: N/A  Psychiatric Specialty Exam: Review of Systems  Psychiatric/Behavioral: Positive for depression and suicidal ideas. Negative for hallucinations, memory loss and substance abuse. The patient is nervous/anxious and has insomnia.     Blood pressure 119/67, pulse 62, temperature 97.8 F (36.6 C), temperature source Oral, resp. rate 18, height 5' 6.5" (1.689 m), weight 80.287 kg (177  lb), SpO2 100 %.Body mass index is 28.14 kg/(m^2).  General Appearance: Disheveled  Eye Sport and exercise psychologist::  Fair  Speech:  Clear and Coherent  Volume:  Decreased  Mood:  Dysphoric and Hopeless  Affect:  Tearful  Thought Process:  Goal Directed and Intact  Orientation:  Full (Time, Place, and Person)  Thought Content:  Rumination  Suicidal Thoughts:  Yes.  with intent/plan  Homicidal Thoughts:  No  Memory:  Immediate;   Good Recent;   Good Remote;   Good  Judgement:  Fair  Insight:  Shallow  Psychomotor Activity:  Decreased  Concentration:  Fair  Recall:  Good  Fund of Knowledge:Good  Language: Good  Akathisia:  No  Handed:  Right  AIMS (if indicated):     Assets:  Communication Skills Desire for Improvement Financial Resources/Insurance Housing Leisure Time Physical Health Resilience  ADL's:  Intact  Cognition: WNL  Sleep:      Treatment Plan Summary: Daily contact with patient to assess and evaluate symptoms and progress in treatment and Medication management  -Continue Remeron 7.5 mg hs for insomnia and depression -Continue vistaril 25 mg every six hours prn anxiety  -Transfer to the adult unit on the 500 hall for continued treatment   Mehmet Scally, NP-C 02/08/2015, 1:38 PM

## 2015-02-08 NOTE — Tx Team (Signed)
Initial Interdisciplinary Treatment Plan   PATIENT STRESSORS: Marital or family conflict Medication change or noncompliance Occupational concerns Traumatic event   PATIENT STRENGTHS: Average or above average intelligence Supportive family/friends Work skills   PROBLEM LIST: Problem List/Patient Goals Date to be addressed Date deferred Reason deferred Estimated date of resolution  Depression 02/08/2015     Suicidal Ideation 02/08/2015     Family stressors 02/08/2015                                          DISCHARGE CRITERIA:  Improved stabilization in mood, thinking, and/or behavior Need for constant or close observation no longer present Reduction of life-threatening or endangering symptoms to within safe limits Verbal commitment to aftercare and medication compliance  PRELIMINARY DISCHARGE PLAN: Return to previous living arrangement Return to previous work or school arrangements  PATIENT/FAMIILY INVOLVEMENT: This treatment plan has been presented to and reviewed with the patient, Andrew Burns.  The patient and family have been given the opportunity to ask questions and make suggestions.  [place] Mon 02/08/2015, 3:47 PM

## 2015-02-08 NOTE — BH Assessment (Addendum)
[facility] Assessment Progress Note Patient was seen this date by Earlene Plater NP and Crist Fat LCAS to evaluate patient to determine disposition. Patient was very tearful stating he still had some passive S/I but did not have a plan and admitted that he has been very depressed for the last week. Patient states he continues to experience symptoms associated with depression, irregular sleep patterns, racing thoughts, feelings of being very overwhelmed, and feelings of isolation. Patient is open to medication interventions but is unsure if he is open to an inpatient admission due to his partner not knowing where he is. This Clinical research associate informed patient that if admitted, [facility] could contact his partner and with his consent inform her of his situation. Patient did agreed to continued monitoring in the Observational Unit while patient's case is discussed and treatment options presented later this date.

## 2015-02-09 ENCOUNTER — Encounter (HOSPITAL_COMMUNITY): Payer: Self-pay | Admitting: Psychiatry

## 2015-02-09 DIAGNOSIS — F1010 Alcohol abuse, uncomplicated: Secondary | ICD-10-CM | POA: Diagnosis present

## 2015-02-09 DIAGNOSIS — F1310 Sedative, hypnotic or anxiolytic abuse, uncomplicated: Secondary | ICD-10-CM

## 2015-02-09 MED ORDER — PANTOPRAZOLE SODIUM 40 MG PO TBEC
40.0000 mg | DELAYED_RELEASE_TABLET | Freq: Every day | ORAL | Status: DC
  Administered 2015-02-10: 40 mg via ORAL
  Filled 2015-02-09 (×5): qty 1

## 2015-02-09 MED ORDER — ASPIRIN EC 81 MG PO TBEC
81.0000 mg | DELAYED_RELEASE_TABLET | Freq: Every day | ORAL | Status: DC
  Administered 2015-02-09 – 2015-02-10 (×2): 81 mg via ORAL
  Filled 2015-02-09 (×6): qty 1

## 2015-02-09 MED ORDER — ATORVASTATIN CALCIUM 20 MG PO TABS
20.0000 mg | ORAL_TABLET | Freq: Every day | ORAL | Status: DC
  Administered 2015-02-09: 20 mg via ORAL
  Filled 2015-02-09: qty 1
  Filled 2015-02-09: qty 2
  Filled 2015-02-09 (×2): qty 1

## 2015-02-09 MED ORDER — METOPROLOL SUCCINATE ER 25 MG PO TB24
25.0000 mg | ORAL_TABLET | Freq: Every day | ORAL | Status: DC
  Administered 2015-02-09 – 2015-02-10 (×2): 25 mg via ORAL
  Filled 2015-02-09 (×6): qty 1

## 2015-02-09 MED ORDER — IRBESARTAN 75 MG PO TABS
37.5000 mg | ORAL_TABLET | Freq: Every day | ORAL | Status: DC
  Administered 2015-02-09 – 2015-02-10 (×2): 37.5 mg via ORAL
  Filled 2015-02-09: qty 1
  Filled 2015-02-09 (×2): qty 0.5
  Filled 2015-02-09: qty 1
  Filled 2015-02-09 (×2): qty 0.5

## 2015-02-09 MED ORDER — DULOXETINE HCL 30 MG PO CPEP
90.0000 mg | ORAL_CAPSULE | Freq: Every day | ORAL | Status: DC
  Administered 2015-02-09 – 2015-02-10 (×2): 90 mg via ORAL
  Filled 2015-02-09 (×5): qty 3

## 2015-02-09 NOTE — Progress Notes (Signed)
Adult Psychoeducational Group Note  Date:  02/09/2015 Time:  8:15 PM  Group Topic/Focus:  Wrap-Up Group:   The focus of this group is to help patients review their daily goal of treatment and discuss progress on daily workbooks.  Participation Level:  Active  Participation Quality:  Appropriate  Affect:  Appropriate  Cognitive:  Appropriate  Insight: Appropriate  Engagement in Group:  Engaged  Modes of Intervention:  Discussion  Additional Comments:  Pt was pleasant during wrap-up group. Pt rated his overall day a 10 out of 10 because he enjoyed being around people throughout the day and enjoyed participating in positive communication and positive interaction with other patients on the unit today. Pt reported that he did not have a goal for the day, and pt noted that waking up this morning was the highlight of his day.   Cleotilde Neer 02/09/2015, 8:36 PM

## 2015-02-09 NOTE — Progress Notes (Signed)
Referral faxed to [facility] counseling per pt request.  Trula Slade, MSW, LCSW Clinical Social Worker 02/09/2015 4:29 PM

## 2015-02-09 NOTE — Progress Notes (Signed)
Patient ID: Andrew Burns, male   DOB: 08-25-1960, 55 y.o.   MRN: [id number] D: Patient denies SI/HI/AVH and pain. Pt rated depression as 0 on 0-10 scale and reports feeling better than he came in. Pt reports he is tolerating medication well.  Pt denies any need or concerns. Pt attended and participated in evening wrap up group. Cooperative with assessment.    A: Met with pt 1:1. Medications administered as prescribed. Support and encouragement provided to engage in milieu. Pt encouraged to discuss feelings and come to staff with any question or concerns.   R: Patient remains safe and complaint with medications.

## 2015-02-09 NOTE — Progress Notes (Signed)
D:  Patient alert and oriented x 4. Patient denies pain/SI/HI/AVH. Patient states, "I just really need some rest, I have not been sleeping well lately."  A: Staff to monitor Q 15 mins for safety. Encouragement and support offered. Scheduled medications administered per orders. R: Patient remains safe on the unit. Patient attended group tonight. Patient visible on the unit. Patient taking administered medications.

## 2015-02-09 NOTE — H&P (Signed)
Psychiatric Admission Assessment Adult  Patient Identification: Andrew Burns  MRN:  [id number]  Date of Evaluation:  02/09/2015  Chief Complaint:  MDD,REC,SEV  Principal Diagnosis: MDD (major depressive disorder), recurrent severe, without psychosis (HCC)  Diagnosis:   Patient Active Problem List   Diagnosis Date Noted  . Alcohol abuse [F10.10] 02/07/2015  . Benzodiazepine abuse [F13.10] 02/07/2015  . MDD (major depressive disorder), recurrent severe, without psychosis (HCC) [F33.2] 02/07/2015  . Dizziness [R42] 07/25/2012  . Essential hypertension [I10] 07/25/2012  . GERD (gastroesophageal reflux disease) [K21.9] 07/04/2012  . Chest pain [R07.9] 07/03/2012   History of Present Illness: Andrew Burns is a 55 year old Caucasian male. Admitted to [facility] as a walk-in with complaint of worsening symptoms of depression leading to suicide attempt by overdose on Xanax tablets & a bottle of [name]. During this assessment, Andrew Burns reports, "I got off of work on Friday, went to a liquor store, got me some liquor (1 bottle of [name]), went to a friend's house, got 5 Xanax pills, went home. On Saturday, I got fed-up, decided to take the pills, drank the whole bottle of liquor, went to sleep. I did not want to wake up. I was ready to give up on life. I slept all Saturday, woke-up, went to my work & talked to my boss about what has been going on with me, then asked for help. My boss brought me here. I'm not a drug addict or an alcoholic. I only took the pills & drank on that Saturday morning. But, my mind races a lot, since 2016. I was taken to the [facility] that time. I was there x 7 days. They started me on Cymbalta & sent me to an outpatient psychiatric clinic associated with [facility].  I don't sleep at night. I'm going through a lot. I have a 16 year old handicap son that I have not seen since of March of 2016. My wife took him. I don't know where they are. I have a bad  marriage".  Objective: Andrew Burns is assessed, chart reviewed. He is alert, oriented x 3 & aware of situation. He appears very depressed, presenting with sad facial expressions, flat affect, unable to make eye contact. He says he has been depressed since 2016 & was hospitalized at [facility] x 7 days. He presents with hx of HTN, IBS & Gerd. He says he has problems controlling his bowels sometimes. He sees Dr. Jonna Coup. He wants to be out of the hospital by Thursday to go to a court hearing for his girlfriend. Andrew Burns says if he is not present at this hearing, that he will lose $1,500.00 he paid to bail his girlfriend out for a breaking & entering charge.  Associated Signs/Symptoms:  Depression Symptoms:  depressed mood, insomnia, feelings of worthlessness/guilt, hopelessness, anxiety, loss of energy/fatigue, suicide attempt by overdose  (Hypo) Manic Symptoms:  Denies any hypomanic episodes  Anxiety Symptoms:  Excessive Worry,  Psychotic Symptoms:  Denies any psychotic symptoms  PTSD Symptoms: Denies  Total Time spent with patient: 1 hour  Past Psychiatric History: Alcohol dependence  Risk to Self: Suicidal Ideation: Yes-Currently Present Suicidal Intent: No-Not Currently/Within Last 6 Months Is patient at risk for suicide?: Yes Suicidal Plan?: No-Not Currently/Within Last 6 Months Access to Means: No What has been your use of drugs/alcohol within the last 12 months?: ETOH and Xanax How many times?: 1 Other Self Harm Risks: None Triggers for Past Attempts: Spouse contact (Pending divorce and estrangement from handicap Son) Intentional Self  Injurious Behavior: None Risk to Others: Homicidal Ideation: No Thoughts of Harm to Others: No Current Homicidal Intent: No Current Homicidal Plan: No Access to Homicidal Means: No Identified Victim: N/A History of harm to others?: No Assessment of Violence: None Noted Violent Behavior Description: N/A Does patient have access to  weapons?: No (Patient denied owning a gun or having access) Criminal Charges Pending?: No Does patient have a court date: No  Prior Inpatient Therapy: Prior Inpatient Therapy: Yes Prior Therapy Dates: 2016 Prior Therapy Facilty/Provider(s): [facility] Reason for Treatment: SI w/plan to OD on Tylenol  Prior Outpatient Therapy: Prior Outpatient Therapy: No Prior Therapy Dates: N/A Prior Therapy Facilty/Provider(s): N/A Reason for Treatment: N/A Does patient have an ACCT team?: No Does patient have Intensive In-House Services?  : No Does patient have [facility] services? : No Does patient have P4CC services?: No  Alcohol Screening: 1. How often do you have a drink containing alcohol?: Never 9. Have you or someone else been injured as a result of your drinking?: Yes, but not in the last year 10. Has a relative or friend or a doctor or another health worker been concerned about your drinking or suggested you cut down?: Yes, but not in the last year Alcohol Use Disorder Identification Test Final Score (AUDIT): 4 Brief Intervention: AUDIT score less than 7 or less-screening does not suggest unhealthy drinking-brief intervention not indicated  Substance Abuse History in the last 12 months:  Yes.    Consequences of Substance Abuse: Medical Consequences:  Liver damage, Possible death by overdose Legal Consequences:  Arrests, jail time, Loss of driving privilege. Family Consequences:  Family discord, divorce and or separation.  Previous Psychotropic Medications: Yes   Psychological Evaluations: Yes   Past Medical History:  Past Medical History  Diagnosis Date  . Hypertension   . Hyperlipemia   . Overweight   . Kidney stones     "no surgeries; have plenty in me" (07/03/2012)  . GERD (gastroesophageal reflux disease)   . H/O cardiac catheterization     Normal coronaries with normal EF 06/2012    Past Surgical History  Procedure Laterality Date  . Inguinal hernia repair Right ~  2012  . Kidney surgery Right ~ 2005    tumor removed from kidney- benign  . Left heart catheterization with coronary angiogram N/A 07/04/2012    Procedure: LEFT HEART CATHETERIZATION WITH CORONARY ANGIOGRAM;  Surgeon: [name], MD;  Location: [facility] CATH LAB;  Service: Cardiovascular;  Laterality: N/A;   Family History:  Family History  Problem Relation Age of Onset  . Stroke Father     multiple - first in his 9's, died @ 59  . Heart attack Mother     unclear timing - she is alive and well @ 53  . Other Brother     4 brothers - alive and well  . Other Sister     2 sisters - alive and well  . Heart attack Sister     died in her 63's, ? MI/drug abuse.   Family Psychiatric  History: Drug addiction: Sister  Social History:  History  Alcohol Use  . 3.6 oz/week  . 6 Cans of beer per week    Comment: 07/03/2012 "1-2 cans beer qod"     History  Drug Use No    Social History   Social History  . Marital Status: Legally Separated    Spouse Name: N/A  . Number of Children: N/A  . Years of Education:  N/A   Social History Main Topics  . Smoking status: Never Smoker   . Smokeless tobacco: Never Used  . Alcohol Use: 3.6 oz/week    6 Cans of beer per week     Comment: 07/03/2012 "1-2 cans beer qod"  . Drug Use: No  . Sexual Activity: Not Currently   Other Topics Concern  . None   Social History Narrative   Lives in [place].  He is married but separated.  His two children are staying with him.  His son is 33 but handicapped ("mind of a 53 yr old).  His dtr is 15.   Additional Social History: Andrew Burns says he has a handicap son he has not seen since March of last year. He is 54 years old.   Allergies:  No Known Allergies  Lab Results: No results found for this or any previous visit (from the past 48 hour(s)).  Metabolic Disorder Labs:  No results found for: HGBA1C, MPG No results found for: PROLACTIN Lab Results  Component Value Date   CHOL 189 07/04/2012    TRIG 186* 07/04/2012   HDL 51 07/04/2012   CHOLHDL 3.7 07/04/2012   VLDL 37 07/04/2012   LDLCALC 101* 07/04/2012   Current Medications: Current Facility-Administered Medications  Medication Dose Route Frequency Provider Last Rate Last Dose  . acetaminophen (TYLENOL) tablet 650 mg  650 mg Oral Q6H PRN Earney Navy, NP      . alum & mag hydroxide-simeth (MAALOX/MYLANTA) 200-200-20 MG/5ML suspension 30 mL  30 mL Oral Q4H PRN Earney Navy, NP      . DULoxetine (CYMBALTA) DR capsule 90 mg  90 mg Oral Daily Saramma Eappen, MD      . hydrOXYzine (ATARAX/VISTARIL) tablet 25 mg  25 mg Oral Q6H PRN Beau Fanny, FNP   25 mg at 02/08/15 1050  . loperamide (IMODIUM) capsule 2-4 mg  2-4 mg Oral PRN Beau Fanny, FNP      . LORazepam (ATIVAN) tablet 1 mg  1 mg Oral Q6H PRN Beau Fanny, FNP      . magnesium hydroxide (MILK OF MAGNESIA) suspension 30 mL  30 mL Oral Daily PRN Earney Navy, NP      . mirtazapine (REMERON) tablet 7.5 mg  7.5 mg Oral QHS Beau Fanny, FNP   7.5 mg at 02/08/15 2156  . multivitamin with minerals tablet 1 tablet  1 tablet Oral Daily Beau Fanny, FNP   1 tablet at 02/09/15 0815  . ondansetron (ZOFRAN) tablet 4 mg  4 mg Oral Q8H PRN Earney Navy, NP      . ondansetron (ZOFRAN-ODT) disintegrating tablet 4 mg  4 mg Oral Q6H PRN Beau Fanny, FNP      . thiamine (VITAMIN B-1) tablet 100 mg  100 mg Oral Daily Beau Fanny, FNP   100 mg at 02/09/15 0815   PTA Medications: Prescriptions prior to admission  Medication Sig Dispense Refill Last Dose  . aspirin EC 81 MG tablet Take 81 mg by mouth daily.   02/06/2015 at Unknown time  . atorvastatin (LIPITOR) 20 MG tablet Take 20 mg by mouth at bedtime.   02/06/2015 at Unknown time  . DULoxetine (CYMBALTA) 60 MG capsule Take 90 mg by mouth daily. Takes with 30 mg to equal 90 mg   Past Week at Unknown time  . metoprolol succinate (TOPROL-XL) 50 MG 24 hr tablet TAKE 1 TABLET BY MOUTH EVERY DAY   unknown   .  olmesartan (BENICAR) 20 MG tablet Take 20 mg by mouth daily.   unknown  . pantoprazole (PROTONIX) 40 MG tablet Take 40 mg by mouth daily.    unknown   Musculoskeletal: Strength & Muscle Tone: within normal limits Gait & Station: normal Patient leans: N/A  Psychiatric Specialty Exam: Physical Exam  Constitutional: He is oriented to person, place, and time. He appears well-developed and well-nourished.  HENT:  Head: Normocephalic.  Eyes: Pupils are equal, round, and reactive to light.  Neck: Normal range of motion.  Cardiovascular: Normal rate.   Respiratory: Effort normal.  GI: Soft.  Genitourinary:  Denies any issues in this area  Musculoskeletal: Normal range of motion.  Neurological: He is alert and oriented to person, place, and time.  Skin: Skin is warm and dry.  Psychiatric: His speech is normal. Thought content normal. His mood appears anxious. His affect is not angry, not blunt, not labile and not inappropriate. He is slowed and withdrawn. Cognition and memory are normal. He expresses impulsivity. He exhibits a depressed mood.    Review of Systems  Constitutional: Positive for malaise/fatigue.  HENT: Negative.   Eyes: Negative.   Respiratory: Negative.   Cardiovascular: Negative.   Gastrointestinal: Negative.   Genitourinary: Negative.   Musculoskeletal: Negative.   Skin: Negative.   Neurological: Positive for weakness.  Endo/Heme/Allergies: Negative.   Psychiatric/Behavioral: Positive for depression and substance abuse. Negative for suicidal ideas (Alcohol/benzodizepine dependence), hallucinations and memory loss. The patient is nervous/anxious and has insomnia.     Blood pressure 128/79, pulse 68, temperature 97.9 F (36.6 C), temperature source Oral, resp. rate 18, height 5' 6.5" (1.689 m), weight 80.287 kg (177 lb), SpO2 100 %.Body mass index is 28.14 kg/(m^2).  General Appearance: Disheveled  Eye Contact::  Poor  Speech:  Slow and clear  Volume:  Decreased   Mood:  Depressed and Worthless  Affect:  Flat and Tearful  Thought Process:  Coherent and Logical  Orientation:  Full (Time, Place, and Person)  Thought Content:  Rumination and denies hallucinations  Suicidal Thoughts:  No  Homicidal Thoughts:  No  Memory:  Grossly intact  Judgement:  Fair  Insight:  Present  Psychomotor Activity:  Decreased  Concentration:  Fair  Recall:  Good  Fund of Knowledge:Fair  Language: Good  Akathisia:  No  Handed:  Right  AIMS (if indicated):     Assets:  Communication Skills Desire for Improvement  ADL's:  Impaired  Cognition: WNL  Sleep:  Number of Hours: 6.75   Treatment Plan/Recommendations: 1. Admit for crisis management and stabilization, estimated length of stay 3-5 days.  2. Medication management to reduce current symptoms to base line and improve the patient's overall level of functioning,   Duloxetine 90 mg for depression, Hydroxyzine 25 mg for anxiety, Lorazepam 1 mg for anxiety, Remeron 7.5 mg for insomnia 3. Treat health problems as indicated.  4. Develop treatment plan to decrease risk of relapse upon discharge and the need for readmission.  5. Psycho-social education regarding relapse prevention and self care.  6. Health care follow up as needed for medical problems.  7. Review, reconcile, and reinstate any pertinent home medications for other health issues where appropriate;, resumed Lipitor 20 mg, ASA EC 81 mg, Avapro 37.5 mg. Toprol XL 25 mg, Protonix EC 40 mg 8. Call for consults with hospitalist for any additional specialty patient care services as needed.  Observation Level/Precautions:  15 minute checks  Laboratory:  Per ED, UDS positive for Benzodiazepine  Psychotherapy: Group  sessions  Medications: Duloxetine 90 mg for depression, Hydroxyzine 25 mg for anxiety, Lorazepam 1 mg for anxiety, Remeron 7.5 mg for insomnia  Consultations: As needed  Discharge Concerns: Safety, sobriety    Estimated LOS: 3-5 days  Other:     I  certify that inpatient services furnished can reasonably be expected to improve the patient's condition.   Sanjuana Kava, PMHNP, FNP-BC 1/24/201712:00 PM

## 2015-02-09 NOTE — Progress Notes (Signed)
D- Patient is in a depressed mood with a flat affect.   Currently denies SI, HI, AVH, and pain.  On assessment, patient was tearful and reported feeling "worthless". Patient verbalized concerns about not being able to see his son.  Patient also reports that he recently "cut off" contact with his daughter because she kept getting in trouble with the law.  Patient's goal for today is to "try and work through my problem".  No other complaints.      A- Scheduled medications administered to patient, per MD orders. Support and encouragement provided.  Routine safety checks conducted every 15 minutes.  Patient informed to notify staff with problems or concerns. R- No adverse drug reactions noted. Patient contracts for safety at this time. Patient compliant with medications and treatment plan. Patient receptive, calm, and cooperative. Patient remains safe at this time.

## 2015-02-09 NOTE — BHH Counselor (Signed)
Adult Comprehensive Assessment  Patient ID: Andrew Burns, male   DOB: 1960/02/11, 55 y.o.   MRN: [id number]  Information Source: Information source: Patient  Current Stressors:  Educational / Learning stressors: 12th grade Employment / Job issues: employed by [employer] for 32 years  Family Relationships: strained relationship with 20 yo daughter. "I don't know where my disabled 82 yo son is. My exwife took him." Pt reports significant stress over this  Financial / Lack of resources (include bankruptcy): income from employment and [employer] / Lack of housing: lives in home in [place] with girlfriend Physical health (include injuries & life threatening diseases): none identified  Social relationships: poor- "I've got one friend." Substance abuse: none prior to alcohol use "right before I came to the hospital." Bereavement / Loss: divorced 2 years ago and "I can't find my 50 yo son. I'm his payee."   Living/Environment/Situation:  Living Arrangements: Non-relatives/Friends Living conditions (as described by patient or guardian): Pt reports that a woman friend lives with him.  How long has patient lived in current situation?: few years  What is atmosphere in current home: Comfortable  Family History:  Marital status: Divorced Divorced, when?: 2 years ago but "we are still dealing with things."  What types of issues is patient dealing with in the relationship?: pt did not wish to elaborate  Additional relationship information: n/a  Are you sexually active?: Yes What is your sexual orientation?: heterosexual  Has your sexual activity been affected by drugs, alcohol, medication, or emotional stress?: n/a  Does patient have children?: Yes How many children?: 2 How is patient's relationship with their children?: 63 year old disabled son "I don't know where he is. my exwife took him away." and 23 yo daughter. "I just had to bail her out of jail. 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 somewhat close with mother. No substance abuse or mental illness in family per pt. Patient's description of current relationship with people who raised him/her: parents are deceased. mom died 3 years ago. dad died 20 years ago.  How were you disciplined when you got in trouble as a child/adolescent?: n/a  Does patient have siblings?: Yes Number of Siblings: 7 Description of patient's current relationship with siblings: 3 brothers and 4 sisters. "I"m still really close with my oldest brother."  Did patient suffer any verbal/emotional/physical/sexual abuse as a child?: No Did patient suffer from severe childhood neglect?: No Patient description of severe childhood neglect: n/a  Has patient ever been sexually abused/assaulted/raped as an adolescent or adult?: No Was the patient ever a victim of a crime or a disaster?: No Witnessed domestic violence?: No Has patient been effected by domestic violence as an adult?: No  Education:  Highest grade of school patient has completed: 12th  Currently a student?: No Name of school: n/a  Solicitor person: N/A Learning disability?: No  Employment/Work Situation:   Employment situation: Employed Where is patient currently employed?: [employer] How long has patient been employed?: 32 years  Patient's job has been impacted by current illness: Yes Describe how patient's job has been impacted: boss noticed pt has been increasingly depressed and came to work smelling like alcohol "so she brought me to the hospital."  What is  the longest time patient has a held a job?: see above  Where was the patient employed at that time?: see above.  Has patient ever been in the [employer]?:  No Has patient ever served in combat?: No Did You Receive Any Psychiatric Treatment/Services While in the [employer]?: No Are There Guns or Other Weapons in Your Home?:  (n/a ) Are These Weapons Safely Secured?:  (n/a)  Financial Resources:   Financial resources: Income from employment, Private insurance Does patient have a representative payee or guardian?: No  Alcohol/Substance Abuse:   What has been your use of drugs/alcohol within the last 12 months?: ETOH and xanax "only right before I came to the hospital in a suicide attempt." Pt reports no other substance abuse and no prior issues with substance abuse/alcohol abuse.  If attempted suicide, did drugs/alcohol play a role in this?: Yes (overdose and got drunk) Alcohol/Substance Abuse Treatment Hx: Past Tx, Inpatient If yes, describe treatment: [facility] hospital for suicide attempt-overdosed on tylenol last year. "I was there for about a week>"  Has alcohol/substance abuse ever caused legal problems?: No  Social Support System:   Forensic psychologist System: Poor Describe Community Support System: "I've only got one close friend." Type of faith/religion: n/a  How does patient's faith help to cope with current illness?: n/a   Leisure/Recreation:   Leisure and Hobbies: nothing"  Strengths/Needs:   What things does the patient do well?: hard worker. trying to get mood stabilized and find his son In what areas does patient struggle / problems for patient: coping with grief/depression/hoplessness/stress.   Discharge Plan:   Does patient have access to transportation?: Yes Will patient be returning to same living situation after discharge?: Yes Currently receiving community mental health services: Yes (From Whom) (PCP Dr. Jonna Coup in [facility]) If no, would patient like referral for services when discharged?: Yes (What county?) ([place] "I work in [place]") Does patient have financial barriers related to  discharge medications?: No  Summary/Recommendations:   Summary and Recommendations (to be completed by the evaluator): Andrew "Annette Stable" is 55 year old male living in [place], [place]. He is employed and was brought to the hospital after his coworker/boss brought him in due to alcohol consumption. Patient reports increased depression and suicidal thoughts due to several life stressors: recent divorce, missing 40 yo disabled son. Recommendations for pt include: crisis stabilization, therapeutic milieu, encourage group attendance and participation, medication management for mood stabilization, and development of comprehensive mental wellness plan. Pt sees his PCP in [place] for mental health medication but is open to a referral to [facility] counseling for both medication managementa nd counseling. Pt is concerned about upcoming court date that he must attend for his daughter on Thursday, 1/26.   Andrew Burns, Andrew Thelin LCSW 02/09/2015 4:18 PM

## 2015-02-09 NOTE — BHH Group Notes (Signed)
[facility] Group Notes:  (Nursing/MHT/Case Management/Adjunct)  Date:  02/09/2015  Time:  1000   Type of Therapy:  Nurse Education  Participation Level:  Minimal  Participation Quality:  Appropriate and Attentive  Affect:  Appropriate  Cognitive:  Alert and Appropriate  Insight:  Appropriate  Engagement in Group:  Engaged  Modes of Intervention:  Discussion and Education  Summary of Progress/Problems: Patient attended group. Patient interacted minimally but appeared to be engaged during group discussion.  Today's group focused on education regarding recovery.  Larry Sierras P 02/09/2015, 1000

## 2015-02-09 NOTE — BHH Group Notes (Signed)
[facility] LCSW Group Therapy  02/09/2015 1:36 PM   Type of Therapy:  Group Therapy  Participation Level:  Active  Participation Quality:  Attentive  Affect:  Appropriate  Cognitive:  Appropriate  Insight:  Improving  Engagement in Therapy:  Engaged  Modes of Intervention:  Clarification, Education, Exploration and Socialization  Summary of Progress/Problems: Today's group focused on relapse prevention.  We defined the term, and then brainstormed on ways to prevent relapse.  Andrew Burns was present for the beginning of group.  He was called out and did not return until towards the end.  He was invited to join in the conversation about community, and shared that the last time he experienced supportive community was when he was going to church with his wife threee years ago.  Became tearful.  Andrew Burns 02/09/2015 , 1:36 PM
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
## 2015-02-09 NOTE — Tx Team (Signed)
Interdisciplinary Treatment Plan Update (Adult)  Date:  02/09/2015  Time Reviewed:  8:55 AM   Progress in Treatment: Attending groups: No. New to unit. Continuing to assess.  Participating in groups:  No. Taking medication as prescribed:  Yes. Tolerating medication:  Yes. Family/Significant othe contact made:  SPE required for this pt.  Patient understands diagnosis:  Yes. and As evidenced by:  seeking treatment for SI, increased depression, recent overdose, and medication stabilization.  Discussing patient identified problems/goals with staff:  Yes. Medical problems stabilized or resolved:  Yes. Denies suicidal/homicidal ideation: No. Pt endorsing passive SI/Able to contract for safety on the unit.  Issues/concerns per patient self-inventory:  Other:  Discharge Plan or Barriers: CSW assessing for appropriate referrals.   Reason for Continuation of Hospitalization: Depression Medication stabilization Suicidal ideation  Comments:  Patient is a 55 yo male that was brought into [facility] by his supervisor at work. Patient too 5 xanax tablets from a friend on 1/20. He took the tablets and drank Shearon Stalls with the intent of taking his life. Patient went into work on 1/21 after drinking the rest of the 1/5 of Shearon Stalls. He told his supervisor that "I just can't take it anymore." Patient reports poor sleep and appetite, crying spells, hopelessness and helplessness. Patient is going through a recent divorce. He has a handicapped son that he does not keep in touch with and his 83 yo daughter has legal charges. Patient reports "having no purpose in life." The patient had a prior hospitalization at [facility] in 2016 for SI with plan to overdose. Patient states he takes 90 mg cymbalta for depression, however, has not taken it in 3 days. Patient presents with flat, tearful affect; his mood is sad and depressed. He is slow to answer questions and is currently lying in bed. He denies any  alcohol/drug use. He is a smoker, but refuses the patch. He denies HI/AVH. Patient has med hx of HTN, gerd and left heart catherization.  Estimated length of stay:  3-5 days   New goal(s): to develop appropriate aftercare plan.   Additional Comments:  Patient and CSW reviewed pt's identified goals and treatment plan. Patient verbalized understanding and agreed to treatment plan. CSW reviewed [facility] "Discharge Process and Patient Involvement" Form. Pt verbalized understanding of information provided and signed form.    Review of initial/current patient goals per problem list:  1. Goal(s): Patient will participate in aftercare plan  Met: No.   Target date: at discharge  As evidenced by: Patient will participate within aftercare plan AEB aftercare provider and housing plan at discharge being identified.  1/24: CSW assessing for appropriate referrals.   2. Goal (s): Patient will exhibit decreased depressive symptoms and suicidal ideations.  Met: No.    Target date: at discharge  As evidenced by: Patient will utilize self rating of depression at 3 or below and demonstrate decreased signs of depression or be deemed stable for discharge by MD.  1/24: Pt rates depression as high and endorses passive SI/able to contract for safety on the unit. Pt denies Hi/AVH.   3. Goal(s): Patient will demonstrate decreased signs of withdrawal due to substance abuse  Met:Yes   Target date:at discharge   As evidenced by: Patient will produce a CIWA/COWS score of 0, have stable vitals signs, and no symptoms of withdrawal.  1/24: Pt denies substance abuse/alcohol abuse other than recent overdose attempt. Pt has stable vitals and reports no signs of withdrawal. CIWA/COWS of 0. Goal met.  Attendees: Patient:   02/09/2015 8:55 AM   Family:   02/09/2015 8:55 AM   Physician:  Dr. Ursula Alert MD 02/09/2015 8:55 AM   Nursing:   Ruel Favors RN 02/09/2015 8:55 AM   Clinical Social Worker:  Maxie Better, LCSW 02/09/2015 8:55 AM   Clinical Social Worker: Roque Lias, LCSW  02/09/2015 8:55 AM   Other:  Gerline Legacy Nurse Case Manager 02/09/2015 8:55 AM   Other:  Agustina Caroli, NP 02/09/2015 8:55 AM   Other:   02/09/2015 8:55 AM   Other:  02/09/2015 8:55 AM   Other:  02/09/2015 8:55 AM   Other:  02/09/2015 8:55 AM    02/09/2015 8:55 AM    02/09/2015 8:55 AM    02/09/2015 8:55 AM    02/09/2015 8:55 AM    Scribe for Treatment Team:   Maxie Better, LCSW 02/09/2015 8:55 AM

## 2015-02-10 LAB — TSH: TSH: 2.289 u[IU]/mL (ref 0.350–4.500)

## 2015-02-10 MED ORDER — DULOXETINE HCL 30 MG PO CPEP: 90.0000 mg | ORAL_CAPSULE | Freq: Every day | ORAL | Status: AC

## 2015-02-10 MED ORDER — MIRTAZAPINE 7.5 MG PO TABS: 7.5000 mg | ORAL_TABLET | Freq: Every day | ORAL | Status: DC

## 2015-02-10 NOTE — Progress Notes (Signed)
Patient discharged home with prescriptions. Patient was stable and appreciative at that time. All papers and prescriptions were given and valuables returned. Verbal understanding expressed. Denies SI/HI and A/VH. Patient given opportunity to express concerns and ask questions.  

## 2015-02-10 NOTE — Discharge Summary (Signed)
Physician Discharge Summary Note  Patient:  Andrew Burns is an 55 y.o., male MRN:  [id number] DOB:  April 24, 1960 Patient phone:  [phone] (home)  Patient address:   [address] [place] [address],  Total Time spent with patient: 30 minutes  Date of Admission:  02/07/2015 Date of Discharge: 02/10/2015  Reason for Admission:  depression  Principal Problem: MDD (major depressive disorder), recurrent severe, without psychosis [facility]) Discharge Diagnoses: Patient Active Problem List   Diagnosis Date Noted  . Alcohol use disorder, mild, abuse [F10.10] 02/09/2015  . Benzodiazepine abuse [F13.10] 02/07/2015  . MDD (major depressive disorder), recurrent severe, without psychosis (HCC) [F33.2] 02/07/2015  . Dizziness [R42] 07/25/2012  . Essential hypertension [I10] 07/25/2012  . GERD (gastroesophageal reflux disease) [K21.9] 07/04/2012  . Chest pain [R07.9] 07/03/2012    Past Psychiatric History:  See above noted  Past Medical History:  Past Medical History  Diagnosis Date  . Hypertension   . Hyperlipemia   . Overweight   . Kidney stones     "no surgeries; have plenty in me" (07/03/2012)  . GERD (gastroesophageal reflux disease)   . H/O cardiac catheterization     Normal coronaries with normal EF 06/2012    Past Surgical History  Procedure Laterality Date  . Inguinal hernia repair Right ~ 2012  . Kidney surgery Right ~ 2005    tumor removed from kidney- benign  . Left heart catheterization with coronary angiogram N/A 07/04/2012    Procedure: LEFT HEART CATHETERIZATION WITH CORONARY ANGIOGRAM;  Surgeon: Andrew Hazel, MD;  Location: [facility] CATH LAB;  Service: Cardiovascular;  Laterality: N/A;   Family History:  Family History  Problem Relation Age of Onset  . Stroke Father     multiple - first in his 32's, died @ 79  . Heart attack Mother     unclear timing - she is alive and well @ 30  . Other Brother     4 brothers - alive and well  . Other Sister     2  sisters - alive and well  . Drug abuse Sister   . Heart attack Sister     died in her 30's, ? MI/drug abuse.   Family Psychiatric  History:  See above noted Social History:  History  Alcohol Use  . 3.6 oz/week  . 6 Cans of beer per week    Comment: 07/03/2012 "1-2 cans beer qod"     History  Drug Use  . Yes  . Special: Benzodiazepines    Social History   Social History  . Marital Status: Legally Separated    Spouse Name: N/A  . Number of Children: N/A  . Years of Education: N/A   Social History Main Topics  . Smoking status: Never Smoker   . Smokeless tobacco: Never Used  . Alcohol Use: 3.6 oz/week    6 Cans of beer per week     Comment: 07/03/2012 "1-2 cans beer qod"  . Drug Use: Yes    Special: Benzodiazepines  . Sexual Activity: Not Currently   Other Topics Concern  . None   Social History Narrative   Lives in [place].  He is married but separated.  His two children are staying with him.  His son is 56 but handicapped ("mind of a 4 yr old).  His dtr is 15.    Hospital Course:  Andrew Burns was admitted for MDD (major depressive disorder), recurrent severe, without psychosis (HCC) and crisis management.  He was treated with  the following medications Cymbalta 90 mg for affective symptoms, Remeron 7.5 mg for sleep, Vistaril 25 mg as needed for anxiety.  He completed CIWA/ativan protocol for alcohol withdrawal without event.  Per nursing patient attended groups to participate in therapeutic milieu.   Andrew Burns was discharged with current medications and was instructed on how to take medications as prescribed; (details listed below under Medication List).  Medical problems were identified and treated as needed.  Home medications were restarted as appropriate.  Improvement was monitored by observation and Andrew Burns.  Emotional and mental status was monitored by daily self-inventory reports completed by Andrew Burns and clinical staff.         Andrew Burns was evaluated by the treatment team for stability and plans for continued recovery upon discharge.  Andrew Burns motivation was an integral factor for scheduling further treatment.  CSW facilitated patient to be referred to CBT outpatient and substance abuse treatment program.  Employment, transportation, bed availability, health status, family support, and any pending legal issues were also considered during his hospital stay.  He was offered further treatment options upon discharge including but not limited to Residential, Intensive Outpatient, and Outpatient treatment.  Andrew Burns will follow up with the services as listed below under Follow Up Information.     Upon completion of this admission the Andrew Burns was both mentally and medically stable for discharge denying suicidal/homicidal ideation, auditory/visual/tactile hallucinations, delusional thoughts and paranoia.     Physical Findings: AIMS: Facial and Oral Movements Muscles of Facial Expression: None, normal Lips and Perioral Area: None, normal Jaw: None, normal Tongue: None, normal,Extremity Movements Upper (arms, wrists, hands, fingers): None, normal Lower (legs, knees, ankles, toes): None, normal, Trunk Movements Neck, shoulders, hips: None, normal, Overall Severity Severity of abnormal movements (highest score from questions above): None, normal Incapacitation due to abnormal movements: None, normal Patient's awareness of abnormal movements (rate only patient's report): No Awareness, Dental Status Current problems with teeth and/or dentures?: No Does patient usually wear dentures?: No  CIWA:  CIWA-Ar Total: 0 COWS:  COWS Total Score: 2  Musculoskeletal: Strength & Muscle Tone: within normal limits Gait & Station: normal Patient leans: N/A  Psychiatric Specialty Exam:  SEE MD SRA Review of Systems  All other systems reviewed and are negative.    Blood pressure 126/80, pulse 64, temperature 98 F (36.7 C), temperature source Oral, resp. rate 16, height 5' 6.5" (1.689 m), weight 80.287 kg (177 lb), SpO2 100 %.Body mass index is 28.14 kg/(m^2).  Have you used any form of tobacco in the last 30 days? (Cigarettes, Smokeless Tobacco, Cigars, and/or Pipes): Yes  Has this patient used any form of tobacco in the last 30 days? (Cigarettes, Smokeless Tobacco, Cigars, and/or Pipes) Yes, offered  Metabolic Disorder Labs:  No results found for: HGBA1C, MPG No results found for: PROLACTIN Lab Results  Component Value Date   CHOL 189 07/04/2012   TRIG 186* 07/04/2012   HDL 51 07/04/2012   CHOLHDL 3.7 07/04/2012   VLDL 37 07/04/2012   LDLCALC 101* 07/04/2012    See Psychiatric Specialty Exam and Suicide Risk Assessment completed by Attending Physician prior to discharge.  Discharge destination:  Home  Is patient on multiple antipsychotic therapies at discharge:  No   Has Patient had three or more failed trials of antipsychotic monotherapy by history:  No  Recommended Plan for Multiple Antipsychotic Therapies: NA     Medication List    STOP taking these  medications        aspirin EC 81 MG tablet     atorvastatin 20 MG tablet  Commonly known as:  LIPITOR     metoprolol succinate 50 MG 24 hr tablet  Commonly known as:  TOPROL-XL     olmesartan 20 MG tablet  Commonly known as:  BENICAR     pantoprazole 40 MG tablet  Commonly known as:  PROTONIX      TAKE these medications      Indication   DULoxetine 30 MG capsule  Commonly known as:  CYMBALTA  Take 3 capsules (90 mg total) by mouth daily.   Indication:  Major Depressive Disorder     mirtazapine 7.5 MG tablet  Commonly known as:  REMERON  Take 1 tablet (7.5 mg total) by mouth at bedtime.   Indication:  Trouble Sleeping, Major Depressive Disorder           Follow-up Information    Follow up with [facility] On 02/15/2015.   Why:  Appt on  this date with Liam Graham on this date at 8:30AM for counseling. The office will schedule your medication management appt during this time. Please bring insurance card with you to this appt.    Contact information:   [address] [place], [place] [address] Phone: [phone] Fax: [phone]      Follow up with [facility]  On 02/15/2015.   Why:  Appt on this date at 4:15PM with Dr. Lillie Fragmin for medication management/hospital follow-up.    Contact information:   ATTN: Dr. Jonna Coup [address] [place], [place] [address] Phone: [phone] Fax: [phone]      Follow-up recommendations:  Activity:  as tol Diet:  as tol  Comments:  1.  Take all your medications as prescribed.              2.  Report any adverse side effects to outpatient provider.                       3.  Patient instructed to not use alcohol or illegal drugs while on prescription medicines.            4.  In the event of worsening symptoms, instructed patient to call 911, the crisis hotline or go to nearest emergency room for evaluation of symptoms.  Signed: Velna Hatchet May Maylyn Narvaiz AGNP-BC 02/10/2015, 10:06 AM

## 2015-02-10 NOTE — Tx Team (Signed)
Interdisciplinary Treatment Plan Update (Adult)  Date:  02/10/2015  Time Reviewed:  11:07 AM   Progress in Treatment: Attending groups: Yes Participating in groups:  Yes Taking medication as prescribed:  Yes. Tolerating medication:  Yes. Family/Significant othe contact made:  Contact attempts made with pt's "friend." SPE completed with pt.  Patient understands diagnosis:  Yes. and As evidenced by:  seeking treatment for SI, increased depression, recent overdose, and medication stabilization.  Discussing patient identified problems/goals with staff:  Yes. Medical problems stabilized or resolved:  Yes. Denies suicidal/homicidal ideation: Yes, during self report.  Issues/concerns per patient self-inventory:  Other:  Discharge Plan or Barriers: Pt has follow-up in place through [facility] for counseling and will be set up for med management during first appt. Medication management follow-up with PCP until connected to [facility] counseling scheduled. Pt's employer will pick him up from hospital today.   Reason for Continuation of Hospitalization: none  Comments:  Patient is a 55 yo male that was brought into [facility] by his supervisor at work. Patient too 5 xanax tablets from a friend on 1/20. He took the tablets and drank Shearon Stalls with the intent of taking his life. Patient went into work on 1/21 after drinking the rest of the 1/5 of Shearon Stalls. He told his supervisor that "I just can't take it anymore." Patient reports poor sleep and appetite, crying spells, hopelessness and helplessness. Patient is going through a recent divorce. He has a handicapped son that he does not keep in touch with and his 32 yo daughter has legal charges. Patient reports "having no purpose in life." The patient had a prior hospitalization at [facility] in 2016 for SI with plan to overdose. Patient states he takes 90 mg cymbalta for depression, however, has not taken it in 3 days. Patient  presents with flat, tearful affect; his mood is sad and depressed. He is slow to answer questions and is currently lying in bed. He denies any alcohol/drug use. He is a smoker, but refuses the patch. He denies HI/AVH. Patient has med hx of HTN, gerd and left heart catherization.  Estimated length of stay:  D/c today  Additional Comments:  Patient and CSW reviewed pt's identified goals and treatment plan. Patient verbalized understanding and agreed to treatment plan. CSW reviewed [facility] "Discharge Process and Patient Involvement" Form. Pt verbalized understanding of information provided and signed form.    Review of initial/current patient goals per problem list:  1. Goal(s): Patient will participate in aftercare plan  Met: Yes  Target date: at discharge  As evidenced by: Patient will participate within aftercare plan AEB aftercare provider and housing plan at discharge being identified.  1/24: CSW assessing for appropriate referrals.   1/25: Pt referred [facility] and has appt scheduled. Follow-up with Dr. Jodi Mourning (PCP) also scheduled.   2. Goal (s): Patient will exhibit decreased depressive symptoms and suicidal ideations.  Met: Yes.    Target date: at discharge  As evidenced by: Patient will utilize self rating of depression at 3 or below and demonstrate decreased signs of depression or be deemed stable for discharge by MD.  1/24: Pt rates depression as high and endorses passive SI/able to contract for safety on the unit. Pt denies Hi/AVH.   1/25: Pt rates depression as "less" today and appears to be nearing his baseline. He is no longer endorsing SI.   3. Goal(s): Patient will demonstrate decreased signs of withdrawal due to substance abuse  Met:Yes   Target  date:at discharge   As evidenced by: Patient will produce a CIWA/COWS score of 0, have stable vitals signs, and no symptoms of withdrawal.  1/24: Pt denies substance abuse/alcohol abuse other than  recent overdose attempt. Pt has stable vitals and reports no signs of withdrawal. CIWA/COWS of 0. Goal met.    Attendees: Patient:   02/10/2015 11:07 AM   Family:   02/10/2015 11:07 AM   Physician:  Dr. Ursula Alert MD 02/10/2015 11:07 AM   Nursing:    02/10/2015 11:07 AM   Clinical Social Worker: Maxie Better, LCSW 02/10/2015 11:07 AM   Clinical Social Worker: Roque Lias, LCSW  02/10/2015 11:07 AM   Other:  Gerline Legacy Nurse Case Manager 02/10/2015 11:07 AM   Other:  Agustina Caroli, NP 02/10/2015 11:07 AM   Other:   02/10/2015 11:07 AM   Other:  02/10/2015 11:07 AM   Other:  02/10/2015 11:07 AM   Other:  02/10/2015 11:07 AM    02/10/2015 11:07 AM    02/10/2015 11:07 AM    02/10/2015 11:07 AM    02/10/2015 11:07 AM    Scribe for Treatment Team:   Maxie Better, LCSW 02/10/2015 11:07 AM

## 2015-02-10 NOTE — Progress Notes (Signed)
  [facility] Adult Case Management Discharge Plan :  Will you be returning to the same living situation after discharge:  Yes,  pt will return to his home in [place]. At discharge, do you have transportation home?: Yes,  friend from work Do you have the ability to pay for your medications: Yes,  [employer] private insurance  Release of information consent forms completed and submitted to medical records by CSW.  Patient to Follow up at: Follow-up Information    Follow up with [facility] On 02/15/2015.   Why:  Appt on this date with Liam Graham on this date at 8:30AM for counseling. The office will schedule your medication management appt during this time. Please bring insurance card with you to this appt.    Contact information:   [address] [place], [place] [address] Phone: [phone] Fax: [phone]      Follow up with [facility]  On 02/15/2015.   Why:  Appt on this date at 4:15PM with Dr. Lillie Fragmin for medication management/hospital follow-up.    Contact information:   ATTN: Dr. Jonna Coup [address] [place], [place] [address] Phone: [phone] Fax: [phone]      Next level of care provider has access to [facility] Link:no  Safety Planning and Suicide Prevention discussed: Yes,  SPE completed with pt; contact attempts made with pt's friend. SPI pamphlet and mobile crisis information provided to pt and he was encouraged to share this information with support network.   Have you used any form of tobacco in the last 30 days? (Cigarettes, Smokeless Tobacco, Cigars, and/or Pipes): Yes  Has patient been referred to the Quitline?: Patient refused referral  Patient has been referred for addiction treatment: Yes, see above.   Smart, Dailyn Kempner LCSW 02/10/2015, 11:11 AM

## 2015-02-10 NOTE — Plan of Care (Signed)
Problem: Diagnosis: Increased Risk For Suicide Attempt Goal: STG-Patient Will Comply With Medication Regime Outcome: Progressing Pt compliant with medication     

## 2015-02-10 NOTE — [facility] Suicide Risk Assessment (Signed)
[facility] INPATIENT:  Family/Significant Other Suicide Prevention Education  Suicide Prevention Education:  Contact Attempts: Annalee Genta (pt's friend) [phone] has been identified by the patient as the family member/significant other with whom the patient will be residing, and identified as the person(s) who will aid the patient in the event of a mental health crisis.  With written consent from the patient, two attempts were made to provide suicide prevention education, prior to and/or following the patient's discharge.  We were unsuccessful in providing suicide prevention education.  A suicide education pamphlet was given to the patient to share with family/significant other.  Date and time of first attempt: 02/09/15 at 4:00PM (no answer/unable to leave vm) Date and time of second attempt: 02/10/15 at 11:00AM.   Smart, Krystyne Tewksbury LCSW 02/10/2015, 11:10 AM

## 2015-02-10 NOTE — [facility] Suicide Risk Assessment (Signed)
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 Alcohol use disorder, mild, abuse [F10.10] 02/09/2015  . Benzodiazepine abuse [F13.10] 02/07/2015  . MDD (major depressive disorder), recurrent severe, without psychosis (HCC) [F33.2] 02/07/2015  . Dizziness [R42] 07/25/2012  . Essential hypertension [I10] 07/25/2012  . GERD (gastroesophageal reflux disease) [K21.9] 07/04/2012  . Chest pain [R07.9] 07/03/2012    Total Time spent with patient: 30 minutes  Musculoskeletal: Strength & Muscle Tone: within normal limits Gait & Station: normal Patient leans: N/A  Psychiatric Specialty Exam: Review of Systems  Psychiatric/Behavioral: Positive for substance abuse. Negative for depression, suicidal ideas and hallucinations. The patient is not nervous/anxious and does not have insomnia.   All other systems reviewed and are negative.   Blood pressure 126/80, pulse 64, temperature 98 F (36.7 C), temperature source Oral, resp. rate 16, height 5' 6.5" (1.689 m), weight 80.287 kg (177 lb), SpO2 100 %.Body mass index is 28.14 kg/(m^2).  General Appearance: Casual  Eye Contact::  Fair  Speech:  Clear and Coherent409  Volume:  Normal  Mood:  Euthymic  Affect:  Congruent  Thought Process:  Goal Directed  Orientation:  Full (Time, Place, and Person)  Thought Content:  WDL  Suicidal Thoughts:  No  Homicidal Thoughts:  No  Memory:  Immediate;   Fair Recent;   Fair Remote;   Fair  Judgement:  Fair  Insight:  Shallow  Psychomotor Activity:  Normal  Concentration:  Fair  Recall:  [employer] of Knowledge:Fair  Language: Fair  Akathisia:  No  Handed:  Right  AIMS (if indicated):     Assets:  Housing Intimacy Social Support Curator Vocational/Educational  Sleep:  Number of Hours: 6.75   Cognition: WNL  ADL's:  Intact   Mental Status Per Nursing Assessment::   On Admission:     Demographic Factors:  Male, Divorced or widowed and Caucasian  Loss Factors: NA  Historical Factors: Impulsivity  Risk Reduction Factors:   Employed and Positive social support  Continued Clinical Symptoms:  Alcohol/Substance Abuse/Dependencies Previous Psychiatric Diagnoses and Treatments  Cognitive Features That Contribute To Risk:  None    Suicide Risk:  Minimal: No identifiable suicidal ideation.  Patients presenting with no risk factors but with morbid ruminations; may be classified as minimal risk based on the severity of the depressive symptoms  Follow-up Information    Follow up with [facility] On 02/15/2015.   Why:  Appt on this date with Liam Graham on this date at 8:30AM for counseling. The office will schedule your medication management appt during this time. Please bring insurance card with you to this appt.    Contact information:   [address] [place], [place] [address] Phone: [phone] Fax: [phone]      Follow up with [facility]  On 02/15/2015.   Why:  Appt on this date at 4:15PM with Dr. Lillie Fragmin for medication management/hospital follow-up.    Contact information:   ATTN: Dr. Jonna Coup [address] [place], [place] [address] Phone: [phone] Fax: [phone]      Plan Of Care/Follow-up recommendations:  Activity:  No restrictions Diet:  regular Tests:  as needed Other:  follow up with after care  Kamaron Deskins, MD 02/10/2015, 9:33 AM

## 2015-03-29 ENCOUNTER — Other Ambulatory Visit (HOSPITAL_COMMUNITY): Payer: Self-pay | Admitting: Psychiatry

## 2015-06-21 ENCOUNTER — Other Ambulatory Visit (HOSPITAL_COMMUNITY): Payer: Self-pay | Admitting: Psychiatry

## 2015-07-19 ENCOUNTER — Other Ambulatory Visit (HOSPITAL_COMMUNITY): Payer: Self-pay | Admitting: Psychiatry
